# Patient Record
Sex: Female | Born: 1995 | Race: Black or African American | Hispanic: No | Smoking: Former smoker
Health system: Southern US, Community
[De-identification: ages and names within clinical notes are randomized; demographics above are authoritative.]

## PROBLEM LIST (undated history)

## (undated) DIAGNOSIS — B3731 Acute candidiasis of vulva and vagina: Secondary | ICD-10-CM

## (undated) DIAGNOSIS — B9689 Other specified bacterial agents as the cause of diseases classified elsewhere: Secondary | ICD-10-CM

## (undated) DIAGNOSIS — N760 Acute vaginitis: Secondary | ICD-10-CM

## (undated) DIAGNOSIS — T7840XA Allergy, unspecified, initial encounter: Secondary | ICD-10-CM

## (undated) DIAGNOSIS — B373 Candidiasis of vulva and vagina: Secondary | ICD-10-CM

## (undated) HISTORY — DX: Acute candidiasis of vulva and vagina: B37.31

## (undated) HISTORY — DX: Other specified bacterial agents as the cause of diseases classified elsewhere: N76.0

## (undated) HISTORY — DX: Other specified bacterial agents as the cause of diseases classified elsewhere: B96.89

## (undated) HISTORY — DX: Allergy, unspecified, initial encounter: T78.40XA

## (undated) HISTORY — PX: WISDOM TOOTH EXTRACTION: SHX21

## (undated) HISTORY — DX: Candidiasis of vulva and vagina: B37.3

---
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
## 2015-08-19 ENCOUNTER — Encounter: Payer: Self-pay | Admitting: Emergency Medicine

## 2015-08-19 ENCOUNTER — Emergency Department
Admission: EM | Admit: 2015-08-19 | Discharge: 2015-08-19 | Disposition: A | Discharge status: Home / Self Care | Payer: BLUE CROSS/BLUE SHIELD | Attending: Student | Admitting: Student

## 2015-08-19 DIAGNOSIS — X501XXA Overexertion from prolonged static or awkward postures, initial encounter: Secondary | ICD-10-CM | POA: Insufficient documentation

## 2015-08-19 DIAGNOSIS — Y929 Unspecified place or not applicable: Secondary | ICD-10-CM | POA: Insufficient documentation

## 2015-08-19 DIAGNOSIS — Y93F2 Activity, caregiving, lifting: Secondary | ICD-10-CM | POA: Insufficient documentation

## 2015-08-19 DIAGNOSIS — S4992XA Unspecified injury of left shoulder and upper arm, initial encounter: Secondary | ICD-10-CM | POA: Diagnosis present

## 2015-08-19 DIAGNOSIS — Y990 Civilian activity done for income or pay: Secondary | ICD-10-CM | POA: Insufficient documentation

## 2015-08-19 DIAGNOSIS — F17200 Nicotine dependence, unspecified, uncomplicated: Secondary | ICD-10-CM | POA: Diagnosis not present

## 2015-08-19 DIAGNOSIS — S46812A Strain of other muscles, fascia and tendons at shoulder and upper arm level, left arm, initial encounter: Secondary | ICD-10-CM | POA: Insufficient documentation

## 2015-08-19 DIAGNOSIS — S46912A Strain of unspecified muscle, fascia and tendon at shoulder and upper arm level, left arm, initial encounter: Secondary | ICD-10-CM

## 2015-08-19 MED ORDER — CYCLOBENZAPRINE HCL 5 MG PO TABS: 5.0000 mg | ORAL_TABLET | Freq: Three times a day (TID) | ORAL | Status: DC | PRN

## 2015-08-19 NOTE — Discharge Instructions (Signed)
Cryotherapy Cryotherapy is when you put ice on your injury. Ice helps lessen pain and puffiness (swelling) after an injury. Ice works the best when you start using it in the first 24 to 48 hours after an injury. HOME CARE  Put a dry or damp towel between the ice pack and your skin.  You may press gently on the ice pack.  Leave the ice on for no more than 10 to 20 minutes at a time.  Check your skin after 5 minutes to make sure your skin is okay.  Rest at least 20 minutes between ice pack uses.  Stop using ice when your skin loses feeling (numbness).  Do not use ice on someone who cannot tell you when it hurts. This includes small children and people with memory problems (dementia). GET HELP RIGHT AWAY IF:  You have white spots on your skin.  Your skin turns blue or pale.  Your skin feels waxy or hard.  Your puffiness gets worse. MAKE SURE YOU:   Understand these instructions.  Will watch your condition.  Will get help right away if you are not doing well or get worse.   This information is not intended to replace advice given to you by your health care provider. Make sure you discuss any questions you have with your health care provider.   Document Released: 08/22/2007 Document Revised: 05/28/2011 Document Reviewed: 10/26/2010 Elsevier Interactive Patient Education 2016 [employer]  Muscle Strain A muscle strain (pulled muscle) happens when a muscle is stretched beyond normal length. It happens when a sudden, violent force stretches your muscle too far. Usually, a few of the fibers in your muscle are torn. Muscle strain is common in athletes. Recovery usually takes 1-2 weeks. Complete healing takes 5-6 weeks.  HOME CARE   Follow the PRICE method of treatment to help your injury get better. Do this the first 2-3 days after the injury:  Protect. Protect the muscle to keep it from getting injured again.  Rest. Limit your activity and rest the injured body part.  Ice.  Put ice in a plastic bag. Place a towel between your skin and the bag. Then, apply the ice and leave it on from 15-20 minutes each hour. After the third day, switch to moist heat packs.  Compression. Use a splint or elastic bandage on the injured area for comfort. Do not put it on too tightly.  Elevate. Keep the injured body part above the level of your heart.  Only take medicine as told by your doctor.  Warm up before doing exercise to prevent future muscle strains. GET HELP IF:   You have more pain or puffiness (swelling) in the injured area.  You feel numbness, tingling, or notice a loss of strength in the injured area. MAKE SURE YOU:   Understand these instructions.  Will watch your condition.  Will get help right away if you are not doing well or get worse.   This information is not intended to replace advice given to you by your health care provider. Make sure you discuss any questions you have with your health care provider.   Document Released: 12/13/2007 Document Revised: 12/24/2012 Document Reviewed: 10/02/2012 Elsevier Interactive Patient Education 2016 [employer].   You appear to have a strain to the trapezius muscle of the left shoulder. Take OTC Aleve (naproxen) every morning and night for the next week or so. Take the prescription muscle relaxant as needed for muscle pain. Do NOT take the muscle relaxant  prior to work or driving. [facility] problems. Consider stretching exercises to warm-up the muscles before work.

## 2015-08-19 NOTE — ED Notes (Signed)
See triage note  States she lifts heavy boxes at work and developed pain from left elbow which radiates into left shoulder   Denies any fall  Positive pulses and good sensation

## 2015-08-19 NOTE — ED Notes (Signed)
Pt to ed with c/o left arm pain after lifting heavy boxes yesterday.

## 2015-08-19 NOTE — ED Provider Notes (Signed)
[facility] Emergency Department Provider Note ____________________________________________  Time seen: [phone]  I have reviewed the triage vital signs and the nursing notes.  HISTORY  Chief Complaint  Arm Pain  HPI Lori Chan is a 20 y.o. female presents to the ED for evaluation of discomfort to the left shoulder blade region. 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 She is right-hand-dominant female with complaints of tightness across the left scapular and trapezius musculature. She denies any outright injury, trauma, accident, or fall. She also denies any previous history shoulder problems. She has taken 2 Excedrin tablets yesterday without noted to get benefit. She denies any other injury at this time, and is without any distal paresthesias or grip changes. She rates her overall discomfort at a 7/10 in triage.  History reviewed. No pertinent past medical history.  There are no active problems to display for this patient.   History reviewed. No pertinent past surgical history.  Current Outpatient Rx  Name  Route  Sig  Dispense  Refill  . cyclobenzaprine (FLEXERIL) 5 MG tablet   Oral   Take 1 tablet (5 mg total) by mouth 3 (three) times daily as needed for muscle spasms.   15 tablet   0    Allergies Ibuprofen and Sulfa antibiotics  History reviewed. No pertinent family history.  Social History Social History  Substance Use Topics  . Smoking status: Current Every Day Smoker  . Smokeless tobacco: None  . Alcohol Use: No   Review of Systems  Constitutional: Negative for fever. Musculoskeletal: Negative for back pain. Left upper back pain. Skin: Negative for rash. Neurological: Negative for headaches, focal weakness or numbness. ____________________________________________  PHYSICAL EXAM:  VITAL SIGNS: ED Triage Vitals  Enc Vitals Group     BP 08/19/15 0824 132/83 mmHg     Pulse Rate  08/19/15 0824 77     Resp 08/19/15 0824 18     Temp 08/19/15 0824 98.3 F (36.8 C)     Temp Source 08/19/15 0824 Oral     SpO2 08/19/15 0824 99 %     Weight 08/19/15 0824 235 lb (106.595 kg)     Height 08/19/15 0824 5\' 3"  (1.6 m)     Head Cir --      Peak Flow --      Pain Score 08/19/15 0803 7     Pain Loc --      Pain Edu? --      Excl. in GC? --    Constitutional: Alert and oriented. Well appearing and in no distress. Head: Normocephalic and atraumatic. Cardiovascular: Normal rate, regular rhythm. Normal distal pulses.  Respiratory: Normal respiratory effort.  Musculoskeletal: Left shoulder without obvious deformity, dislocation, or sulcus sign. Patient in the palpation along the left trapezius as well as musculature. Normal rotator cuff testing and resistance on exam. Normal composite fist bilaterally. Normal spinal nontender without midline tenderness, spasm, deformity, or step-off. Normal neck range of motion without deficit. Nontender with normal range of motion in all extremities.  Neurologic:  Cranial nerves II through XII grossly intact. Normal UE DTRs bilaterally. Normal gait without ataxia. Normal speech and language. No gross focal neurologic deficits are appreciated. Skin:  Skin is warm, dry and intact. No rash noted. ____________________________________________  INITIAL IMPRESSION / ASSESSMENT AND PLAN / ED COURSE  Patient with acute left shoulder strain and trapezius muscle strain secondary to overuse. 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 She should dose the cyclobenzaprine as directed. She is also dose over-the-counter Aleve as needed for pain relief. She is advised to apply ice to the muscles for soreness as necessary. She is also given instruction on exercises to warm up the arms and shoulders prior to work. She will follow up with Dr. Hyacinth Meeker as  needed. ____________________________________________  FINAL CLINICAL IMPRESSION(S) / ED DIAGNOSES  Final diagnoses:  Shoulder strain, left, initial encounter  Trapezius muscle strain, left, initial encounter     Lissa Hoard, PA-C 08/19/15 1638  Gayla Doss, MD 08/19/15 1655

## 2015-12-07 ENCOUNTER — Encounter: Payer: Self-pay | Admitting: Emergency Medicine

## 2015-12-07 DIAGNOSIS — F17200 Nicotine dependence, unspecified, uncomplicated: Secondary | ICD-10-CM | POA: Diagnosis not present

## 2015-12-07 DIAGNOSIS — R21 Rash and other nonspecific skin eruption: Secondary | ICD-10-CM | POA: Diagnosis present

## 2015-12-07 NOTE — ED Triage Notes (Signed)
Patient ambulatory to triage with steady gait, without difficulty or distress noted; pt reports itchy rash to arms/legs x 2 days with no known cause; using OTC cortisone and benadryl without relief

## 2015-12-08 ENCOUNTER — Emergency Department
Admission: EM | Admit: 2015-12-08 | Discharge: 2015-12-08 | Disposition: A | Discharge status: Home / Self Care | Payer: BLUE CROSS/BLUE SHIELD | Attending: Student | Admitting: Student

## 2015-12-08 DIAGNOSIS — R21 Rash and other nonspecific skin eruption: Secondary | ICD-10-CM

## 2015-12-08 MED ORDER — DEXAMETHASONE 1 MG/ML PO CONC: 10.0000 mg | Freq: Once | ORAL | Status: DC

## 2015-12-08 MED ORDER — DEXAMETHASONE SODIUM PHOSPHATE 10 MG/ML IJ SOLN
INTRAMUSCULAR | Status: AC
  Administered 2015-12-08: 10 mg
  Filled 2015-12-08: qty 1

## 2015-12-08 MED ORDER — DEXAMETHASONE SODIUM PHOSPHATE 10 MG/ML IJ SOLN
10.0000 mg | Freq: Once | INTRAMUSCULAR | Status: AC
  Administered 2015-12-08: 10 mg

## 2015-12-08 NOTE — ED Provider Notes (Signed)
[facility] Emergency Department Provider Note   ____________________________________________   First MD Initiated Contact with Patient 12/08/15 0105     (approximate)  I have reviewed the triage vital signs and the nursing notes.   HISTORY  Chief Complaint Rash    HPI Dilia Alemany is a 20 y.o. female with no chronic medical problems who presents for evaluation of pruritic rash on bilateral arms for the past 2 days, gradual onset, constant, moderate, no modifying factors. Patient reports she has seen a few lesions on the right leg as well. She reports a history of allergic reaction to Bactrim but denies any contact with that. She denies any new soaps, detergents, lotions. No abdominal pain, vomiting, diarrhea, shortness of breath or lip swelling. She has used over the counter cortisone ointment and Benadryl without significant improvement of her symptoms. She denies fevers. She has never had a severe allergic reaction requiring EpiPen.   History reviewed. No pertinent past medical history.  There are no active problems to display for this patient.   History reviewed. No pertinent surgical history.  Prior to Admission medications   Medication Sig Start Date End Date Taking? Authorizing Provider  cyclobenzaprine (FLEXERIL) 5 MG tablet Take 1 tablet (5 mg total) by mouth 3 (three) times daily as needed for muscle spasms. 08/19/15   Jenise V Bacon Menshew, PA-C    Allergies Ibuprofen and Sulfa antibiotics  No family history on file.  Social History Social History  Substance Use Topics  . Smoking status: Current Every Day Smoker  . Smokeless tobacco: Never Used  . Alcohol use No    Review of Systems Constitutional: No fever/chills Eyes: No visual changes. ENT: No sore throat. Cardiovascular: Denies chest pain. Respiratory: Denies shortness of breath. Gastrointestinal: No abdominal pain.  No nausea, no vomiting.  No diarrhea.  No  constipation. Genitourinary: Negative for dysuria. Musculoskeletal: Negative for back pain. Skin: Positive for rash. Neurological: Negative for headaches, focal weakness or numbness.  10-point ROS otherwise negative.  ____________________________________________   PHYSICAL EXAM:  VITAL SIGNS: ED Triage Vitals  Enc Vitals Group     BP 12/07/15 2309 104/74     Pulse Rate 12/07/15 2309 93     Resp 12/07/15 2309 18     Temp 12/07/15 2309 97.6 F (36.4 C)     Temp src --      SpO2 12/07/15 2309 99 %     Weight 12/07/15 2308 239 lb (108.4 kg)     Height 12/07/15 2308 5\' 3"  (1.6 m)     Head Circumference --      Peak Flow --      Pain Score 12/08/15 0101 0     Pain Loc --      Pain Edu? --      Excl. in GC? --     Constitutional: Alert and oriented. Well appearing and in no acute distress. Eyes: Conjunctivae are normal. PERRL. EOMI. Head: Atraumatic. Nose: No congestion/rhinnorhea. Mouth/Throat: Mucous membranes are moist.  Oropharynx non-erythematous Without edema. Neck: No stridor.   Cardiovascular: Normal rate, regular rhythm. Grossly normal heart sounds.  Good peripheral circulation. Respiratory: Normal respiratory effort.  No retractions. Lungs CTAB. Gastrointestinal: Soft and nontender. No distention. No CVA tenderness. Genitourinary: deferred Musculoskeletal: No lower extremity tenderness nor edema.  No joint effusions. Neurologic:  Normal speech and language. No gross focal neurologic deficits are appreciated. No gait instability. Skin:  Skin is warm, dry and intact. There are several round discrete raised,  mildly erythematous, blanching papules in the arms bilaterally, one on the leg totaling about 15-20 papules. No involvement of the mucous membranes or the palms of the hands. No surrounding erythema, induration, warmth or fluctuance. Psychiatric: Mood and affect are normal. Speech and behavior are normal.  ____________________________________________   LABS (all  labs ordered are listed, but only abnormal results are displayed)  Labs Reviewed - No data to display ____________________________________________  EKG  none ____________________________________________  RADIOLOGY  none ____________________________________________   PROCEDURES  Procedure(s) performed: None  Procedures  Critical Care performed: No  ____________________________________________   INITIAL IMPRESSION / ASSESSMENT AND PLAN / ED COURSE  Pertinent labs & imaging results that were available during my care of the patient were reviewed by me and considered in my medical decision making (see chart for details).  Elmer SowDenisha Lun is a 20 y.o. female with no chronic medical problems who presents for evaluation of pruritic rash on bilateral arms for the past 2 days. On exam, she is very well-appearing and in no acute distress. Her vital signs are stable and she is afebrile. She has a nonspecific rash which at first appears more consistent with individual insect bite though this could also represent contact dermatitis. She reports that she hasn't had known contact with bedbugs or chiggers but she has had many mosquito bites. She received by mouth Decadron in the emergency department and is encouraged to continue using over-the-counter hydrocortisone and Benadryl according to package instructions. We discussed return precautions and need for close follow-up and she is comfortable with the discharge plan. DC home.  Clinical Course     ____________________________________________   FINAL CLINICAL IMPRESSION(S) / ED DIAGNOSES  Final diagnoses:  Rash      NEW MEDICATIONS STARTED DURING THIS VISIT:  New Prescriptions   No medications on file     Note:  This document was prepared using Dragon voice recognition software and may include unintentional dictation errors.    Gayla DossEryka A Jeanie Mccard, MD 12/08/15 0230

## 2016-07-11 ENCOUNTER — Emergency Department
Admission: EM | Admit: 2016-07-11 | Discharge: 2016-07-11 | Disposition: A | Discharge status: Home / Self Care | Payer: BLUE CROSS/BLUE SHIELD | Attending: Student in an Organized Health Care Education/Training Program | Admitting: Student in an Organized Health Care Education/Training Program

## 2016-07-11 ENCOUNTER — Encounter: Payer: Self-pay | Admitting: Emergency Medicine

## 2016-07-11 DIAGNOSIS — R112 Nausea with vomiting, unspecified: Secondary | ICD-10-CM | POA: Insufficient documentation

## 2016-07-11 DIAGNOSIS — R197 Diarrhea, unspecified: Secondary | ICD-10-CM | POA: Diagnosis not present

## 2016-07-11 DIAGNOSIS — Z87891 Personal history of nicotine dependence: Secondary | ICD-10-CM | POA: Insufficient documentation

## 2016-07-11 LAB — COMPREHENSIVE METABOLIC PANEL
ALBUMIN: 3.7 g/dL (ref 3.5–5.0)
ALT: 12 U/L — AB (ref 14–54)
AST: 22 U/L (ref 15–41)
Alkaline Phosphatase: 70 U/L (ref 38–126)
Anion gap: 7 (ref 5–15)
BUN: 7 mg/dL (ref 6–20)
CO2: 23 mmol/L (ref 22–32)
CREATININE: 0.75 mg/dL (ref 0.44–1.00)
Calcium: 8.6 mg/dL — ABNORMAL LOW (ref 8.9–10.3)
Chloride: 105 mmol/L (ref 101–111)
GFR calc Af Amer: >60 mL/min (ref >60)
GFR calc non Af Amer: >60 mL/min (ref >60)
Glucose, Bld: 101 mg/dL — ABNORMAL HIGH (ref 65–99)
POTASSIUM: 3.2 mmol/L — AB (ref 3.5–5.1)
SODIUM: 135 mmol/L (ref 135–145)
Total Bilirubin: 0.3 mg/dL (ref 0.3–1.2)
Total Protein: 7.8 g/dL (ref 6.5–8.1)

## 2016-07-11 LAB — CBC
HEMATOCRIT: 34.7 % — AB (ref 35.0–47.0)
Hemoglobin: 11.7 g/dL — ABNORMAL LOW (ref 12.0–16.0)
MCH: 26.9 pg (ref 26.0–34.0)
MCHC: 33.7 g/dL (ref 32.0–36.0)
MCV: 79.6 fL — AB (ref 80.0–100.0)
PLATELETS: 362 10*3/uL (ref 150–440)
RBC: 4.36 MIL/uL (ref 3.80–5.20)
RDW: 15.3 % — AB (ref 11.5–14.5)
WBC: 9.4 10*3/uL (ref 3.6–11.0)

## 2016-07-11 LAB — LIPASE, BLOOD: LIPASE: 33 U/L (ref 11–51)

## 2016-07-11 MED ORDER — PROMETHAZINE HCL 25 MG/ML IJ SOLN
INTRAMUSCULAR | Status: AC
  Filled 2016-07-11: qty 1

## 2016-07-11 MED ORDER — PROMETHAZINE HCL 12.5 MG PO TABS: 12.5000 mg | ORAL_TABLET | Freq: Four times a day (QID) | ORAL | 0 refills | Status: DC | PRN

## 2016-07-11 MED ORDER — ONDANSETRON 4 MG PO TBDP
4.0000 mg | ORAL_TABLET | Freq: Once | ORAL | Status: AC
  Administered 2016-07-11: 4 mg via ORAL

## 2016-07-11 MED ORDER — PROMETHAZINE HCL 25 MG/ML IJ SOLN
12.5000 mg | Freq: Once | INTRAMUSCULAR | Status: DC
  Filled 2016-07-11: qty 1

## 2016-07-11 MED ORDER — SODIUM CHLORIDE 0.9 % IV BOLUS (SEPSIS)
1000.0000 mL | Freq: Once | INTRAVENOUS | Status: DC
  Filled 2016-07-11: qty 1000

## 2016-07-11 MED ORDER — ONDANSETRON 4 MG PO TBDP
ORAL_TABLET | ORAL | Status: AC
  Filled 2016-07-11: qty 1

## 2016-07-11 NOTE — ED Notes (Signed)
Pt refused IV and IV fluids states " I don't do needles". Attempted to explain benefit of fluids and IV meds, but patient is persistent about refusal. MD made aware, po meds given.

## 2016-07-11 NOTE — ED Provider Notes (Signed)
[facility] Emergency Department Provider Note    First MD Initiated Contact with Patient 07/11/16 2129     (approximate)  I have reviewed the triage vital signs and the nursing notes.   HISTORY  Chief Complaint Abdominal Pain    HPI Lori Chan is a 21 y.o. female who presents with 1 day of nausea vomiting and for times of nonbloody diarrhea. States that with the diarrhea she's been having crampy abdominal pain. Denies any eating any uncooked food. No fevers. Has had some chills. No chest pain or shortness of breath. Denies any pain at this moment.States that when she was having the pain it was 9 out of 10 in severity, crampy and diffuse. Was not worsened by eating or movement. Did resolve after having episodes of diarrhea.   History reviewed. No pertinent past medical history. FMH:  No h/o IBD History reviewed. No pertinent surgical history. There are no active problems to display for this patient.     Prior to Admission medications   Medication Sig Start Date End Date Taking? Authorizing Provider  cyclobenzaprine (FLEXERIL) 5 MG tablet Take 1 tablet (5 mg total) by mouth 3 (three) times daily as needed for muscle spasms. 08/19/15   Jenise V Bacon Menshew, PA-C  promethazine (PHENERGAN) 12.5 MG tablet Take 1 tablet (12.5 mg total) by mouth every 6 (six) hours as needed for nausea or vomiting. 07/11/16   Willy Eddy, MD    Allergies Ibuprofen and Sulfa antibiotics    Social History Social History  Substance Use Topics  . Smoking status: Former Games developer  . Smokeless tobacco: Never Used  . Alcohol use No    Review of Systems Patient denies headaches, rhinorrhea, blurry vision, numbness, shortness of breath, chest pain, edema, cough, abdominal pain, nausea, vomiting, diarrhea, dysuria, fevers, rashes or hallucinations unless otherwise stated above in HPI. ____________________________________________   PHYSICAL EXAM:  VITAL  SIGNS: Vitals:   07/11/16 2051 07/11/16 2234  BP: 129/70 122/70  Pulse: (!) 113 68  Resp: 18 17  Temp: 99.7 F (37.6 C)     Constitutional: Alert and oriented. Well appearing and in no acute distress. Eyes: Conjunctivae are normal. PERRL. EOMI. Head: Atraumatic. Nose: No congestion/rhinnorhea. Mouth/Throat: Mucous membranes are moist.  Oropharynx non-erythematous. Neck: No stridor. Painless ROM. No cervical spine tenderness to palpation Hematological/Lymphatic/Immunilogical: No cervical lymphadenopathy. Cardiovascular: Normal rate, regular rhythm. Grossly normal heart sounds.  Good peripheral circulation. Respiratory: Normal respiratory effort.  No retractions. Lungs CTAB. Gastrointestinal: Soft and nontender. No distention. No abdominal bruits. No CVA tenderness. Genitourinary:  Musculoskeletal: No lower extremity tenderness nor edema.  No joint effusions. Neurologic:  Normal speech and language. No gross focal neurologic deficits are appreciated. No gait instability. Skin:  Skin is warm, dry and intact. No rash noted. Psychiatric: Mood and affect are normal. Speech and behavior are normal.  ____________________________________________   LABS (all labs ordered are listed, but only abnormal results are displayed)  Results for orders placed or performed during the hospital encounter of 07/11/16 (from the past 24 hour(s))  Lipase, blood     Status: None   Collection Time: 07/11/16  8:53 PM  Result Value Ref Range   Lipase 33 11 - 51 U/L  Comprehensive metabolic panel     Status: Abnormal   Collection Time: 07/11/16  8:53 PM  Result Value Ref Range   Sodium 135 135 - 145 mmol/L   Potassium 3.2 (L) 3.5 - 5.1 mmol/L   Chloride 105 101 - 111  mmol/L   CO2 23 22 - 32 mmol/L   Glucose, Bld 101 (H) 65 - 99 mg/dL   BUN 7 6 - 20 mg/dL   Creatinine, Ser 1.61 0.44 - 1.00 mg/dL   Calcium 8.6 (L) 8.9 - 10.3 mg/dL   Total Protein 7.8 6.5 - 8.1 g/dL   Albumin 3.7 3.5 - 5.0 g/dL   AST  22 15 - 41 U/L   ALT 12 (L) 14 - 54 U/L   Alkaline Phosphatase 70 38 - 126 U/L   Total Bilirubin 0.3 0.3 - 1.2 mg/dL   GFR calc non Af Amer >60 >60 mL/min   GFR calc Af Amer >60 >60 mL/min   Anion gap 7 5 - 15  CBC     Status: Abnormal   Collection Time: 07/11/16  8:53 PM  Result Value Ref Range   WBC 9.4 3.6 - 11.0 K/uL   RBC 4.36 3.80 - 5.20 MIL/uL   Hemoglobin 11.7 (L) 12.0 - 16.0 g/dL   HCT 09.6 (L) 04.5 - 40.9 %   MCV 79.6 (L) 80.0 - 100.0 fL   MCH 26.9 26.0 - 34.0 pg   MCHC 33.7 32.0 - 36.0 g/dL   RDW 81.1 (H) 91.4 - 78.2 %   Platelets 362 150 - 440 K/uL   ____________________________________________   ____________________________________________  ____________________________________________   PROCEDURES  Procedure(s) performed:  Procedures    Critical Care performed: no ____________________________________________   INITIAL IMPRESSION / ASSESSMENT AND PLAN / ED COURSE  Pertinent labs & imaging results that were available during my care of the patient were reviewed by me and considered in my medical decision making (see chart for details).  DDX: enteritis, colitis, flu like illness, food poisoning  Lori Chan is a 21 y.o. who presents to the ED with GI symptoms as described above.  Patient is AF, with mild tachycardia but otherwise well perfused and normotensive.  Exam as above. Given current presentation have considered the above differential.  Blood work is fairly reassured. They start in her symptoms I did recommend IV fluids but the patient refused this. Her abdominal exam is soft and benign. I do not feel that emergent CT imaging is clinically indicated as this does not seem to represent an acute surgical process. Patient was given Zofran ODT with improvement in her symptoms. She is able to tolerate oral hydration. Repeat abdominal exam was again soft and benign. Patient provided a prescription for antiemetics for trial of outpatient management. Discussed  signs and symptoms for which she should return to the ER.  Have discussed with the patient and available family all diagnostics and treatments performed thus far and all questions were answered to the best of my ability. The patient demonstrates understanding and agreement with plan.        ____________________________________________   FINAL CLINICAL IMPRESSION(S) / ED DIAGNOSES  Final diagnoses:  Nausea vomiting and diarrhea      NEW MEDICATIONS STARTED DURING THIS VISIT:  Discharge Medication List as of 07/11/2016 10:29 PM    START taking these medications   Details  promethazine (PHENERGAN) 12.5 MG tablet Take 1 tablet (12.5 mg total) by mouth every 6 (six) hours as needed for nausea or vomiting., Starting Wed 07/11/2016, Print         Note:  This document was prepared using Dragon voice recognition software and may include unintentional dictation errors.    Willy Eddy, MD 07/11/16 [phone]

## 2016-07-11 NOTE — ED Triage Notes (Signed)
Patient ambulatory to triage with steady gait, without difficulty or distress noted; pt reports lower abd pain since this morning; Vx4 with diarrhea

## 2016-07-11 NOTE — ED Notes (Signed)
Pt reports that she has been having abd pain with N/V and diarrhea since this am (vomited x4 today - 15+ loose stools today)

## 2016-09-10 ENCOUNTER — Emergency Department (HOSPITAL_COMMUNITY)
Admission: EM | Admit: 2016-09-10 | Discharge: 2016-09-10 | Disposition: A | Discharge status: Home / Self Care | Payer: BLUE CROSS/BLUE SHIELD | Attending: Emergency Medicine | Admitting: Emergency Medicine

## 2016-09-10 ENCOUNTER — Encounter (HOSPITAL_COMMUNITY): Payer: Self-pay | Admitting: *Deleted

## 2016-09-10 ENCOUNTER — Emergency Department (HOSPITAL_COMMUNITY): Payer: BLUE CROSS/BLUE SHIELD

## 2016-09-10 DIAGNOSIS — Z87891 Personal history of nicotine dependence: Secondary | ICD-10-CM | POA: Diagnosis not present

## 2016-09-10 DIAGNOSIS — J181 Lobar pneumonia, unspecified organism: Secondary | ICD-10-CM | POA: Insufficient documentation

## 2016-09-10 DIAGNOSIS — J189 Pneumonia, unspecified organism: Secondary | ICD-10-CM

## 2016-09-10 DIAGNOSIS — R05 Cough: Secondary | ICD-10-CM | POA: Diagnosis present

## 2016-09-10 MED ORDER — ALBUTEROL SULFATE (2.5 MG/3ML) 0.083% IN NEBU
2.5000 mg | INHALATION_SOLUTION | Freq: Once | RESPIRATORY_TRACT | Status: AC
  Administered 2016-09-10: 2.5 mg via RESPIRATORY_TRACT
  Filled 2016-09-10: qty 3

## 2016-09-10 MED ORDER — ALBUTEROL SULFATE HFA 108 (90 BASE) MCG/ACT IN AERS
2.0000 | INHALATION_SPRAY | Freq: Once | RESPIRATORY_TRACT | Status: AC
  Administered 2016-09-10: 2 via RESPIRATORY_TRACT
  Filled 2016-09-10: qty 6.7

## 2016-09-10 MED ORDER — LEVOFLOXACIN 500 MG PO TABS: 500.0000 mg | ORAL_TABLET | Freq: Every day | ORAL | 0 refills | Status: DC

## 2016-09-10 MED ORDER — LEVOFLOXACIN 500 MG PO TABS
500.0000 mg | ORAL_TABLET | Freq: Once | ORAL | Status: AC
  Administered 2016-09-10: 500 mg via ORAL
  Filled 2016-09-10: qty 1

## 2016-09-10 MED ORDER — IPRATROPIUM-ALBUTEROL 0.5-2.5 (3) MG/3ML IN SOLN
3.0000 mL | Freq: Once | RESPIRATORY_TRACT | Status: AC
  Administered 2016-09-10: 3 mL via RESPIRATORY_TRACT
  Filled 2016-09-10: qty 3

## 2016-09-10 NOTE — ED Triage Notes (Signed)
States she has had a cough for over 2 weeks and some episodes of diarrhea also.

## 2016-09-10 NOTE — ED Provider Notes (Signed)
[facility]-EMERGENCY DEPT Provider Note   CSN: [id number] Arrival date & time: 09/10/16  0920     History   Chief Complaint Chief Complaint  Patient presents with  . Cough    HPI Lori Chan is a 21 y.o. female.  HPI   Lori Chan is a 21 y.o. female who presents to the Emergency Department complaining of Persistent cough for 2 weeks. States the cough is intermittently productive of clear sputum. She also reports some nasal congestion at onset, but has now improved. She's been taking over-the-counter cough and cold medications with minimal relief. She reports increased coughing and wheezing with exertion. Symptoms began after her child came home sick from daycare.  She denies fever, chills, shortness of breath and chest pain.  History reviewed. No pertinent past medical history.  There are no active problems to display for this patient.   History reviewed. No pertinent surgical history.  OB History    No data available       Home Medications    Prior to Admission medications   Medication Sig Start Date End Date Taking? Authorizing Provider  cyclobenzaprine (FLEXERIL) 5 MG tablet Take 1 tablet (5 mg total) by mouth 3 (three) times daily as needed for muscle spasms. 08/19/15   Menshew, Charlesetta Ivory, PA-C  promethazine (PHENERGAN) 12.5 MG tablet Take 1 tablet (12.5 mg total) by mouth every 6 (six) hours as needed for nausea or vomiting. 07/11/16   Willy Eddy, MD    Family History No family history on file.  Social History Social History  Substance Use Topics  . Smoking status: Former Games developer  . Smokeless tobacco: Never Used  . Alcohol use No     Allergies   Ibuprofen and Sulfa antibiotics   Review of Systems Review of Systems  Constitutional: Negative for activity change, appetite change, chills and fever.  HENT: Positive for congestion and rhinorrhea. Negative for facial swelling, sneezing, sore throat and trouble swallowing.   Eyes: Negative for  visual disturbance.  Respiratory: Positive for cough and wheezing. Negative for chest tightness, shortness of breath and stridor.   Cardiovascular: Negative for chest pain.  Gastrointestinal: Negative for abdominal pain, nausea and vomiting.  Musculoskeletal: Negative for neck pain and neck stiffness.  Skin: Negative.  Negative for rash.  Neurological: Negative for dizziness, weakness, numbness and headaches.  Hematological: Negative for adenopathy.  Psychiatric/Behavioral: Negative for confusion.  All other systems reviewed and are negative.    Physical Exam Updated Vital Signs BP 117/68   Pulse (!) 105   Temp 98 F (36.7 C) (Oral)   Resp 20   Ht 5\' 3"  (1.6 m)   Wt 110.7 kg (244 lb)   LMP 08/27/2016   SpO2 99%   BMI 43.22 kg/m   Physical Exam  Constitutional: She is oriented to person, place, and time. She appears well-developed and well-nourished. No distress.  HENT:  Head: Normocephalic and atraumatic.  Right Ear: Tympanic membrane and ear canal normal.  Left Ear: Tympanic membrane and ear canal normal.  Mouth/Throat: Uvula is midline, oropharynx is clear and moist and mucous membranes are normal. No oropharyngeal exudate.  Eyes: EOM are normal. Pupils are equal, round, and reactive to light.  Neck: Normal range of motion, full passive range of motion without pain and phonation normal. Neck supple.  Cardiovascular: Normal rate, regular rhythm and normal heart sounds.   No murmur heard. Pulmonary/Chest: Effort normal. No stridor. No respiratory distress. She has wheezes. She has no rales. She exhibits no  tenderness.  Coarse lungs sounds with scattered expiratory wheezes bilaterally  Musculoskeletal: She exhibits no edema.  Lymphadenopathy:    She has no cervical adenopathy.  Neurological: She is alert and oriented to person, place, and time. She exhibits normal muscle tone. Coordination normal.  Skin: Skin is warm and dry. No rash noted.  Nursing note and vitals  reviewed.    ED Treatments / Results  Labs (all labs ordered are listed, but only abnormal results are displayed) Labs Reviewed - No data to display  EKG  EKG Interpretation None       Radiology Dg Chest 2 View  Result Date: 09/10/2016 CLINICAL DATA:  Two weeks of cough with 3 days of productive cough. Current smoker. EXAM: CHEST  2 VIEW COMPARISON:  Chest x-ray of Aug 09, 2010 FINDINGS: There is hazy increased alveolar opacity in the periphery of the left mid lung. The right lung is clear. There is no pleural effusion or pneumothorax. The heart and pulmonary vascularity are normal. The bony thorax is unremarkable. IMPRESSION: Pneumonia in the left mid lung. Follow-up radiographs following anticipated antibiotic therapy are recommended unless the patient's symptoms completely resolve. Electronically Signed   By: David  SwazilandJordan M.D.   On: 09/10/2016 11:04     Procedures Procedures (including critical care time)  Medications Ordered in ED Medications  albuterol (PROVENTIL HFA;VENTOLIN HFA) 108 (90 Base) MCG/ACT inhaler 2 puff (not administered)  ipratropium-albuterol (DUONEB) 0.5-2.5 (3) MG/3ML nebulizer solution 3 mL (not administered)  albuterol (PROVENTIL) (2.5 MG/3ML) 0.083% nebulizer solution 2.5 mg (not administered)     Initial Impression / Assessment and Plan / ED Course  I have reviewed the triage vital signs and the nursing notes.  Pertinent labs & imaging results that were available during my care of the patient were reviewed by me and considered in my medical decision making (see chart for details).     Patient is well-appearing. Vital signs are stable. Symptoms are likely related to bronchitis. Lung sounds improved after albuterol neb.  Discussed XR findings, pt appears stable for d/c.  Albuterol MDI dispensed.  Pt agrees to PCP f/u to ensure resolution.  Return precautions also discussed.  Final Clinical Impressions(s) / ED Diagnoses   Final diagnoses:    Community acquired pneumonia of left lung, unspecified part of lung    New Prescriptions New Prescriptions   No medications on file     Pauline Ausriplett, Trevon Strothers, Cordelia Poche-C 09/12/16 1751    Bethann BerkshireZammit, Joseph, MD 09/14/16 1016

## 2016-09-10 NOTE — Discharge Instructions (Signed)
1-2 puffs of the inhaler every 4-6 hours as needed. Tylenol or ibuprofen every 4-6 hours if needed for fever or body aches. Drink plenty of fluids. Follow-up with your doctor for recheck or return to the ER for any worsening symptoms.

## 2017-01-18 ENCOUNTER — Emergency Department (HOSPITAL_COMMUNITY): Payer: Self-pay

## 2017-01-18 ENCOUNTER — Emergency Department (HOSPITAL_COMMUNITY)
Admission: EM | Admit: 2017-01-18 | Discharge: 2017-01-18 | Disposition: A | Discharge status: Home / Self Care | Payer: Self-pay | Attending: Emergency Medicine | Admitting: Emergency Medicine

## 2017-01-18 ENCOUNTER — Encounter (HOSPITAL_COMMUNITY): Payer: Self-pay | Admitting: Emergency Medicine

## 2017-01-18 DIAGNOSIS — R072 Precordial pain: Secondary | ICD-10-CM | POA: Insufficient documentation

## 2017-01-18 DIAGNOSIS — Z87891 Personal history of nicotine dependence: Secondary | ICD-10-CM | POA: Insufficient documentation

## 2017-01-18 LAB — BASIC METABOLIC PANEL
ANION GAP: 10 (ref 5–15)
BUN: 10 mg/dL (ref 6–20)
CO2: 23 mmol/L (ref 22–32)
Calcium: 9.1 mg/dL (ref 8.9–10.3)
Chloride: 106 mmol/L (ref 101–111)
Creatinine, Ser: 0.73 mg/dL (ref 0.44–1.00)
Glucose, Bld: 104 mg/dL — ABNORMAL HIGH (ref 65–99)
POTASSIUM: 3.9 mmol/L (ref 3.5–5.1)
SODIUM: 139 mmol/L (ref 135–145)

## 2017-01-18 LAB — CBC WITH DIFFERENTIAL/PLATELET
BASOS ABS: 0 10*3/uL (ref 0.0–0.1)
Basophils Relative: 0 %
EOS ABS: 0.1 10*3/uL (ref 0.0–0.7)
EOS PCT: 1 %
HCT: 35.4 % — ABNORMAL LOW (ref 36.0–46.0)
Hemoglobin: 11.6 g/dL — ABNORMAL LOW (ref 12.0–15.0)
LYMPHS PCT: 27 %
Lymphs Abs: 2.3 10*3/uL (ref 0.7–4.0)
MCH: 26.8 pg (ref 26.0–34.0)
MCHC: 32.8 g/dL (ref 30.0–36.0)
MCV: 81.8 fL (ref 78.0–100.0)
Monocytes Absolute: 0.4 10*3/uL (ref 0.1–1.0)
Monocytes Relative: 4 %
Neutro Abs: 5.9 10*3/uL (ref 1.7–7.7)
Neutrophils Relative %: 68 %
PLATELETS: 328 10*3/uL (ref 150–400)
RBC: 4.33 MIL/uL (ref 3.87–5.11)
RDW: 14.4 % (ref 11.5–15.5)
WBC: 8.7 10*3/uL (ref 4.0–10.5)

## 2017-01-18 LAB — TROPONIN I

## 2017-01-18 LAB — HCG, QUANTITATIVE, PREGNANCY

## 2017-01-18 MED ORDER — GI COCKTAIL ~~LOC~~
30.0000 mL | Freq: Once | ORAL | Status: AC
  Administered 2017-01-18: 30 mL via ORAL
  Filled 2017-01-18: qty 30

## 2017-01-18 NOTE — ED Provider Notes (Signed)
Emergency Department Provider Note   I have reviewed the triage vital signs and the nursing notes.   HISTORY  Chief Complaint Chest Pain   HPI Lori Chan is a 21 y.o. female presents to the emergency department for evaluation of worsening chest tightness over the past week.  Patient has had central tightness in her chest which is been near constant for the last 7 days.  Yesterday at work got suddenly worse when she felt lightheaded.  Pain radiated slightly to the left shoulder.  She took her blood pressure at work which she said was 140s and heart rate in the 70s.  She felt like these were elevated and so when she continued to have chest discomfort this morning she presented to the emergency department.  She is having active pain at this moment.  She states it is worse with moving especially twisting to the left.  No pleuritic or exertional component to pain.  No fevers, chills, productive cough.   History reviewed. No pertinent past medical history.  There are no active problems to display for this patient.   History reviewed. No pertinent surgical history.  Current Outpatient Rx  . Order #: [id number] Class: Print  . Order #: [id number] Class: Print  . Order #: [id number] Class: Historical Med    Allergies Ibuprofen and Sulfa antibiotics  No family history on file.  Social History Social History  Substance Use Topics  . Smoking status: Former Games developer  . Smokeless tobacco: Never Used  . Alcohol use No    Review of Systems  Constitutional: No fever/chills   Eyes: No visual changes. ENT: No sore throat. Cardiovascular: Positive chest pain. Respiratory: Denies shortness of breath. Gastrointestinal: No abdominal pain. No nausea, no vomiting. No diarrhea. No constipation. Genitourinary: Negative for dysuria. Musculoskeletal: Negative for back pain. Skin: Negative for rash. Neurological: Negative for headaches, focal weakness or numbness.  10-point ROS otherwise  negative.  ____________________________________________   PHYSICAL EXAM:  VITAL SIGNS: ED Triage Vitals  Enc Vitals Group     BP --      Pulse Rate 01/18/17 0703 63     Resp 01/18/17 0703 18     Temp 01/18/17 0703 98.3 F (36.8 C)     Temp src --      SpO2 01/18/17 0703 100 %     Weight 01/18/17 0702 245 lb (111.1 kg)     Height 01/18/17 0702 5\' 3"  (1.6 m)     Pain Score 01/18/17 0701 8   Constitutional: Alert and oriented. Well appearing and in no acute distress. Eyes: Conjunctivae are normal. Head: Atraumatic. Nose: No congestion/rhinnorhea. Mouth/Throat: Mucous membranes are moist.  Neck: No stridor.  Cardiovascular: Normal rate, regular rhythm. Good peripheral circulation. Grossly normal heart sounds.   Respiratory: Normal respiratory effort.  No retractions. Lungs CTAB. Gastrointestinal: Soft and nontender. No distention.  Musculoskeletal: No lower extremity tenderness nor edema. No gross deformities of extremities. Positive tenderness to palpation over the sternum.  Neurologic:  Normal speech and language. No gross focal neurologic deficits are appreciated.  Skin:  Skin is warm, dry and intact. No rash noted.   ____________________________________________   LABS (all labs ordered are listed, but only abnormal results are displayed)  Labs Reviewed  CBC WITH DIFFERENTIAL/PLATELET - Abnormal; Notable for the following:       Result Value   Hemoglobin 11.6 (*)    HCT 35.4 (*)    All other components within normal limits  BASIC METABOLIC PANEL - Abnormal; Notable for the  following:    Glucose, Bld 104 (*)    All other components within normal limits  TROPONIN I  HCG, QUANTITATIVE, PREGNANCY   ____________________________________________  EKG   EKG Interpretation  Date/Time:  Friday January 18 2017 07:07:41 EDT Ventricular Rate:  66 PR Interval:    QRS Duration: 99 QT Interval:  412 QTC Calculation: 432 R Axis:   51 Text Interpretation:  Sinus rhythm  No STEMI. No old tracings.  Confirmed by Alona BeneLong, Evelina Lore [phone]([id number]) on 01/18/2017 7:17:16 AM       ____________________________________________  RADIOLOGY  Dg Chest 2 View  Result Date: 01/18/2017 CLINICAL DATA:  Chest pain EXAM: CHEST  2 VIEW COMPARISON:  September 10, 2016 FINDINGS: The lungs are clear. Heart size and pulmonary vascularity are normal. No adenopathy. No pneumothorax. No bone lesions. IMPRESSION: No edema or consolidation. Electronically Signed   By: Bretta BangWilliam  Woodruff III M.D.   On: 01/18/2017 08:23    ____________________________________________   PROCEDURES  Procedure(s) performed:   Procedures  None ____________________________________________   INITIAL IMPRESSION / ASSESSMENT AND PLAN / ED COURSE  Pertinent labs & imaging results that were available during my care of the patient were reviewed by me and considered in my medical decision making (see chart for details).  Patient presents to the emergency department for evaluation of constant chest discomfort over the past week, worsening yesterday.  Patient has elevated BMI is a smoker.  No family history of acute MI at a very young age.  Patient symptoms are reproducible to palpation of the sternum and worse with twisting.  Suspect musculoskeletal etiology.  Patient also discussing some improvement with eating.  With plan to try GI cocktail.  Will obtain single troponin, chest x-ray and reassess.  Patient is low risk for PE by Wells and PERC negative.   Differential includes all life-threatening causes for chest pain. This includes but is not exclusive to acute coronary syndrome, aortic dissection, pulmonary embolism, cardiac tamponade, community-acquired pneumonia, pericarditis, musculoskeletal chest wall pain, etc.  Labs and CXR unremarkable. Suspect MSK etiology but will have the patient follow with PCP. With constant symptoms plan for no enzyme trending in the ED.   At this time, I do not feel there is any  life-threatening condition present. I have reviewed and discussed all results (EKG, imaging, lab, urine as appropriate), exam findings with patient. I have reviewed nursing notes and appropriate previous records.  I feel the patient is safe to be discharged home without further emergent workup. Discussed usual and customary return precautions. Patient and family (if present) verbalize understanding and are comfortable with this plan.  Patient will follow-up with their primary care provider. If they do not have a primary care provider, information for follow-up has been provided to them. All questions have been answered.   ____________________________________________  FINAL CLINICAL IMPRESSION(S) / ED DIAGNOSES  Final diagnoses:  Precordial chest pain     MEDICATIONS GIVEN DURING THIS VISIT:  Medications  gi cocktail (Maalox,Lidocaine,Donnatal) (30 mLs Oral Given 01/18/17 0759)     NEW OUTPATIENT MEDICATIONS STARTED DURING THIS VISIT:  None   Note:  This document was prepared using Dragon voice recognition software and may include unintentional dictation errors.  Alona BeneJoshua Zeki Bedrosian, MD Emergency Medicine    Timithy Arons, Arlyss RepressJoshua G, MD 01/18/17 1515

## 2017-01-18 NOTE — ED Triage Notes (Signed)
Pt c/o cp x 1 week. C/o worsening tightness since yesterday. Also reports elevated BP and sweating.

## 2017-01-18 NOTE — Discharge Instructions (Signed)

## 2017-05-16 ENCOUNTER — Encounter (HOSPITAL_COMMUNITY): Payer: Self-pay | Admitting: Emergency Medicine

## 2017-05-16 ENCOUNTER — Emergency Department (HOSPITAL_COMMUNITY)
Admission: EM | Admit: 2017-05-16 | Discharge: 2017-05-16 | Disposition: A | Discharge status: Home / Self Care | Payer: BLUE CROSS/BLUE SHIELD | Attending: Emergency Medicine | Admitting: Emergency Medicine

## 2017-05-16 DIAGNOSIS — J329 Chronic sinusitis, unspecified: Secondary | ICD-10-CM

## 2017-05-16 DIAGNOSIS — J019 Acute sinusitis, unspecified: Secondary | ICD-10-CM | POA: Insufficient documentation

## 2017-05-16 DIAGNOSIS — Z87891 Personal history of nicotine dependence: Secondary | ICD-10-CM | POA: Insufficient documentation

## 2017-05-16 DIAGNOSIS — J069 Acute upper respiratory infection, unspecified: Secondary | ICD-10-CM

## 2017-05-16 DIAGNOSIS — Z79899 Other long term (current) drug therapy: Secondary | ICD-10-CM | POA: Insufficient documentation

## 2017-05-16 MED ORDER — PROMETHAZINE-DM 6.25-15 MG/5ML PO SYRP: 5.0000 mL | ORAL_SOLUTION | Freq: Four times a day (QID) | ORAL | 0 refills | Status: DC | PRN

## 2017-05-16 MED ORDER — DEXAMETHASONE 4 MG PO TABS: 4.0000 mg | ORAL_TABLET | Freq: Two times a day (BID) | ORAL | 0 refills | Status: DC

## 2017-05-16 MED ORDER — LORATADINE-PSEUDOEPHEDRINE ER 5-120 MG PO TB12: 1.0000 | ORAL_TABLET | Freq: Two times a day (BID) | ORAL | 0 refills | Status: DC

## 2017-05-16 NOTE — ED Provider Notes (Signed)
[facility] EMERGENCY DEPARTMENT Provider Note   CSN: [id number] Arrival date & time: 05/16/17  4098     History   Chief Complaint Chief Complaint  Patient presents with  . Nasal Congestion    HPI Lori Chan is a 22 y.o. female.  The history is provided by the patient.  URI   This is a new problem. The current episode started more than 1 week ago. The problem has been gradually worsening. There has been no fever. Associated symptoms include congestion, ear pain, headaches, rhinorrhea, sinus pain, sneezing and sore throat. Pertinent negatives include no chest pain, no abdominal pain, no diarrhea, no vomiting, no dysuria, no neck pain, no cough and no wheezing. Treatments tried: OTC cold medication.    History reviewed. No pertinent past medical history.  There are no active problems to display for this patient.   History reviewed. No pertinent surgical history.  OB History    No data available       Home Medications    Prior to Admission medications   Medication Sig Start Date End Date Taking? Authorizing Provider  fexofenadine (ALLEGRA) 180 MG tablet Take 180 mg by mouth daily.   Yes [provider]  Pseudoeph-Doxylamine-DM-APAP 30-6.25-15-325 MG CAPS Take 2 capsules by mouth at bedtime.   Yes [provider]  Pseudoephedrine-APAP-DM (DAYQUIL PO) Take 2 capsules by mouth every 12 (twelve) hours.   Yes [provider]    Family History History reviewed. No pertinent family history.  Social History Social History   Tobacco Use  . Smoking status: Former Games developer  . Smokeless tobacco: Never Used  Substance Use Topics  . Alcohol use: No  . Drug use: No     Allergies   Ibuprofen and Sulfa antibiotics   Review of Systems Review of Systems  Constitutional: Negative for activity change.       All ROS Neg except as noted in HPI  HENT: Positive for congestion, ear pain, rhinorrhea, sinus pain, sneezing and sore throat. Negative for  nosebleeds.   Eyes: Negative for photophobia and discharge.  Respiratory: Negative for cough, shortness of breath and wheezing.   Cardiovascular: Negative for chest pain and palpitations.  Gastrointestinal: Negative for abdominal pain, blood in stool, diarrhea and vomiting.  Genitourinary: Negative for dysuria, frequency and hematuria.  Musculoskeletal: Negative for arthralgias, back pain and neck pain.  Skin: Negative.   Neurological: Positive for headaches. Negative for dizziness, seizures and speech difficulty.  Psychiatric/Behavioral: Negative for confusion and hallucinations.     Physical Exam Updated Vital Signs BP 90/70 (BP Location: Right Arm)   Pulse 76   Temp 98.4 F (36.9 C) (Oral)   Resp 16   Ht 5\' 3"  (1.6 m)   Wt 108.9 kg (240 lb)   LMP 05/09/2017   SpO2 100%   BMI 42.51 kg/m   Physical Exam  Constitutional: She is oriented to person, place, and time. She appears well-developed and well-nourished.  Non-toxic appearance.  HENT:  Head: Normocephalic.  Right Ear: Tympanic membrane and external ear normal.  Left Ear: Tympanic membrane and external ear normal.  Pain to percussion over the sinuses. Nasal congestion present.  Eyes: EOM and lids are normal. Pupils are equal, round, and reactive to light.  Neck: Normal range of motion. Neck supple. Carotid bruit is not present.  Cardiovascular: Normal rate, regular rhythm, normal heart sounds, intact distal pulses and normal pulses.  Pulmonary/Chest: Breath sounds normal. No respiratory distress.  Abdominal: Soft. Bowel sounds are normal. There  is no tenderness. There is no guarding.  Musculoskeletal: Normal range of motion.  Lymphadenopathy:       Head (right side): No submandibular adenopathy present.       Head (left side): No submandibular adenopathy present.    She has no cervical adenopathy.  Neurological: She is alert and oriented to person, place, and time. She has normal strength. No cranial nerve deficit or  sensory deficit.  Skin: Skin is warm and dry.  Psychiatric: She has a normal mood and affect. Her speech is normal.  Nursing note and vitals reviewed.    ED Treatments / Results  Labs (all labs ordered are listed, but only abnormal results are displayed) Labs Reviewed - No data to display  EKG  EKG Interpretation None       Radiology No results found.  Procedures Procedures (including critical care time)  Medications Ordered in ED Medications - No data to display   Initial Impression / Assessment and Plan / ED Course  I have reviewed the triage vital signs and the nursing notes.  Pertinent labs & imaging results that were available during my care of the patient were reviewed by me and considered in my medical decision making (see chart for details).       Final Clinical Impressions(s) / ED Diagnoses MDM  Vital signs reviewed.  Examination is consistent with sinusitis and upper respiratory infection.  Patient will be treated with Decadron, Claritin-D, and promethazine DM.  I have instructed the patient on the importance of good handwashing.  I have instructed the patient on the importance of good hydration.  No evidence for any mastoiditis.  No peritonsillar abscess noted, and no evidence for any systemic infection related to the sinus infections.  No periorbital cellulitis.  Patient will follow up with the primary physician or return to the emergency department if any changes or problems.   Final diagnoses:  Sinusitis, unspecified chronicity, unspecified location  Upper respiratory tract infection, unspecified type    ED Discharge Orders        Ordered    dexamethasone (DECADRON) 4 MG tablet  2 times daily with meals     05/16/17 1117    loratadine-pseudoephedrine (CLARITIN-D 12 HOUR) 5-120 MG tablet  2 times daily     05/16/17 1117    promethazine-dextromethorphan (PROMETHAZINE-DM) 6.25-15 MG/5ML syrup  4 times daily PRN     05/16/17 1117       Ivery QualeBryant,  Masiah Lewing, PA-C 05/16/17 1122    Bethann BerkshireZammit, Joseph, MD 05/16/17 1337

## 2017-05-16 NOTE — Discharge Instructions (Signed)
Please increase your fluids such as water, juices, Gatorade's.  Please wash hands frequently.  Have all members in your home wash hands frequently.  Use Decadron and Claritin-D every 12 hours.  Use Tylenol extra strength for fever or aching.  Use promethazine DM for cough.This medication may cause drowsiness. Please do not drink, drive, or participate in activity that requires concentration while taking this medication.

## 2017-05-16 NOTE — ED Triage Notes (Signed)
Pt reports scratchy throat with pressure in face for about a week with slight cough.

## 2017-07-16 ENCOUNTER — Other Ambulatory Visit: Payer: Self-pay

## 2017-07-16 ENCOUNTER — Emergency Department: Payer: Self-pay

## 2017-07-16 ENCOUNTER — Emergency Department
Admission: EM | Admit: 2017-07-16 | Discharge: 2017-07-16 | Disposition: A | Discharge status: Home / Self Care | Payer: Self-pay | Attending: Emergency Medicine | Admitting: Emergency Medicine

## 2017-07-16 ENCOUNTER — Encounter: Payer: Self-pay | Admitting: Emergency Medicine

## 2017-07-16 DIAGNOSIS — J40 Bronchitis, not specified as acute or chronic: Secondary | ICD-10-CM | POA: Insufficient documentation

## 2017-07-16 DIAGNOSIS — R05 Cough: Secondary | ICD-10-CM

## 2017-07-16 DIAGNOSIS — Z79899 Other long term (current) drug therapy: Secondary | ICD-10-CM | POA: Insufficient documentation

## 2017-07-16 DIAGNOSIS — F17200 Nicotine dependence, unspecified, uncomplicated: Secondary | ICD-10-CM | POA: Insufficient documentation

## 2017-07-16 DIAGNOSIS — R059 Cough, unspecified: Secondary | ICD-10-CM

## 2017-07-16 MED ORDER — PREDNISONE 10 MG (21) PO TBPK: ORAL_TABLET | ORAL | 0 refills | Status: DC

## 2017-07-16 MED ORDER — ALBUTEROL SULFATE HFA 108 (90 BASE) MCG/ACT IN AERS: 2.0000 | INHALATION_SPRAY | Freq: Four times a day (QID) | RESPIRATORY_TRACT | 0 refills | Status: DC | PRN

## 2017-07-16 MED ORDER — BENZONATATE 100 MG PO CAPS: 100.0000 mg | ORAL_CAPSULE | Freq: Four times a day (QID) | ORAL | 0 refills | Status: DC | PRN

## 2017-07-16 MED ORDER — PREDNISONE 20 MG PO TABS
60.0000 mg | ORAL_TABLET | Freq: Once | ORAL | Status: AC
  Administered 2017-07-16: 60 mg via ORAL
  Filled 2017-07-16: qty 3

## 2017-07-16 MED ORDER — BENZONATATE 100 MG PO CAPS
100.0000 mg | ORAL_CAPSULE | Freq: Once | ORAL | Status: AC
  Administered 2017-07-16: 100 mg via ORAL
  Filled 2017-07-16: qty 1

## 2017-07-16 NOTE — ED Provider Notes (Signed)
[facility] Emergency Department Provider Note   ____________________________________________   First MD Initiated Contact with Patient 07/16/17 0109     (approximate)  I have reviewed the triage vital signs and the nursing notes.   HISTORY  Chief Complaint Cough    HPI Lori Chan is a 22 y.o. female who comes into the hospital today with a cough.  She reports that she is unable to sleep and it hurts to cough.  Also she has pain in her left ear.  She states that the symptoms started 2 days ago but the ear pain started today.  The patient has been taking Robitussin at home but she states it has not been helping.  The patient states that she has been coughing up occasionally clear phlegm but sometimes it has some green tint to it.  The patient had some posttussive emesis yesterday.  She denies any fevers.  The patient's son is sick from daycare but the patient states that this is been going on for too long and she wanted to get it checked out.  History reviewed. No pertinent past medical history.  There are no active problems to display for this patient.   History reviewed. No pertinent surgical history.  Prior to Admission medications   Medication Sig Start Date End Date Taking? Authorizing Provider  albuterol (PROVENTIL HFA;VENTOLIN HFA) 108 (90 Base) MCG/ACT inhaler Inhale 2 puffs into the lungs every 6 (six) hours as needed. 07/16/17   Rebecka Apley, MD  benzonatate (TESSALON PERLES) 100 MG capsule Take 1 capsule (100 mg total) by mouth every 6 (six) hours as needed for cough. 07/16/17   Rebecka Apley, MD  dexamethasone (DECADRON) 4 MG tablet Take 1 tablet (4 mg total) by mouth 2 (two) times daily with a meal. 05/16/17   Ivery Quale, PA-C  fexofenadine (ALLEGRA) 180 MG tablet Take 180 mg by mouth daily.    [provider]  loratadine-pseudoephedrine (CLARITIN-D 12 HOUR) 5-120 MG tablet Take 1 tablet by mouth 2 (two) times daily.  05/16/17   Ivery Quale, PA-C  predniSONE (STERAPRED UNI-PAK 21 TAB) 10 MG (21) TBPK tablet Take 6 tabs on day 1 Take 5 tabs on day 2 Take 4 tabs on day 3 Take 3 tabs on day 4 Take 2 tabs on day 5 Take 1 tab on day 6 07/16/17   Rebecka Apley, MD  promethazine-dextromethorphan (PROMETHAZINE-DM) 6.25-15 MG/5ML syrup Take 5 mLs by mouth 4 (four) times daily as needed for cough. 05/16/17   Ivery Quale, PA-C  Pseudoeph-Doxylamine-DM-APAP 30-6.25-15-325 MG CAPS Take 2 capsules by mouth at bedtime.    [provider]  Pseudoephedrine-APAP-DM (DAYQUIL PO) Take 2 capsules by mouth every 12 (twelve) hours.    [provider]    Allergies Ibuprofen and Sulfa antibiotics  No family history on file.  Social History Social History   Tobacco Use  . Smoking status: Current Every Day Smoker  . Smokeless tobacco: Never Used  Substance Use Topics  . Alcohol use: No  . Drug use: No    Review of Systems  Constitutional: No fever/chills Eyes: No visual changes. ENT: left ear pain Cardiovascular: chest pain with cough Respiratory: cough Gastrointestinal: No abdominal pain.  No nausea, no vomiting.  No diarrhea.  No constipation. Genitourinary: Negative for dysuria. Musculoskeletal: Negative for back pain. Skin: Negative for rash. Neurological: Negative for headaches, focal weakness or numbness.   ____________________________________________   PHYSICAL EXAM:  VITAL SIGNS: ED Triage Vitals  Enc  Vitals Group     BP 07/16/17 0026 113/70     Pulse Rate 07/16/17 0026 97     Resp 07/16/17 0026 20     Temp 07/16/17 0026 98.2 F (36.8 C)     Temp Source 07/16/17 0026 Oral     SpO2 07/16/17 0026 99 %     Weight 07/16/17 0025 240 lb (108.9 kg)     Height 07/16/17 0025  (1.6 m)     Head Circumference --      Peak Flow --      Pain Score 07/16/17 0025 0     Pain Loc --      Pain Edu? --      Excl. in GC? --    Constitutional: Alert and oriented. Well  appearing and in mild distress. Ears: right TM gray flat and dull, left TM with some effusion with no erythema Eyes: Conjunctivae are normal. PERRL. EOMI. Head: Atraumatic. Nose: No congestion/rhinnorhea. Mouth/Throat: Mucous membranes are moist.  Oropharynx non-erythematous. Cardiovascular: Normal rate, regular rhythm. Grossly normal heart sounds.  Good peripheral circulation. Respiratory: Normal respiratory effort.  No retractions. Lungs CTAB. Gastrointestinal: Soft and nontender. No distention. Positive bowel sounds Musculoskeletal: No lower extremity tenderness nor edema.  Neurologic:  Normal speech and language.  Skin:  Skin is warm, dry and intact.  Psychiatric: Mood and affect are normal.   ____________________________________________   LABS (all labs ordered are listed, but only abnormal results are displayed)  Labs Reviewed - No data to display ____________________________________________  EKG  none ____________________________________________  RADIOLOGY  ED MD interpretation:  CXR: Mild central airways thickening as may be seen with reactive airways or bronchitis. No focal pulmonary infiltrate.  Official radiology report(s): Dg Chest 2 View  Result Date: 07/16/2017 CLINICAL DATA:  Productive cough EXAM: CHEST - 2 VIEW COMPARISON:  01/18/2017 FINDINGS: Mild central airways thickening. No focal opacity or pleural effusion. Normal heart size. No pneumothorax. IMPRESSION: Mild central airways thickening as may be seen with reactive airways or bronchitis. No focal pulmonary infiltrate. Electronically Signed   By: Jasmine Pang M.D.   On: 07/16/2017 01:48    ____________________________________________   PROCEDURES  Procedure(s) performed: None  Procedures  Critical Care performed: No  ____________________________________________   INITIAL IMPRESSION / ASSESSMENT AND PLAN / ED COURSE  As part of my medical decision making, I reviewed the following data within  the electronic MEDICAL RECORD NUMBER Notes from prior ED visits and [place] Controlled Substance Database   This is a 22 year old female who comes into the hospital today with a cough and some left-sided ear pain.  My differential diagnosis includes bronchitis, costochondritis, otitis media, otitis externa, pneumonia  The patient's vital signs are unremarkable and she is in no acute distress.  She has no tenderness to palpation of her chest and her lungs sound clear.  I will send the patient for chest x-ray looking for possible pneumonia.  I will give the patient some benzonatate and prednisone with the concern of bronchitis.  She is not wheezing at this time but I will reassess the patient.  The patient's chest x-ray does not show a pneumonia.  I will discharge the patient to home to have a follow-up with her primary care physician or with the acute care clinic.      ____________________________________________   FINAL CLINICAL IMPRESSION(S) / ED DIAGNOSES  Final diagnoses:  Cough  Bronchitis     ED Discharge Orders        Ordered  benzonatate (TESSALON PERLES) 100 MG capsule  Every 6 hours PRN     07/16/17 0201    predniSONE (STERAPRED UNI-PAK 21 TAB) 10 MG (21) TBPK tablet     07/16/17 0201    albuterol (PROVENTIL HFA;VENTOLIN HFA) 108 (90 Base) MCG/ACT inhaler  Every 6 hours PRN     07/16/17 0201       Note:  This document was prepared using Dragon voice recognition software and may include unintentional dictation errors.    Rebecka Apley, MD 07/16/17 [phone]

## 2017-07-16 NOTE — ED Notes (Addendum)
Pt reports a cough for several days. cig smoker.  No fever .  Pt states she is taking otc meds without relief.  States coughing up green phlegm.  Pt has left earache.  Pt alert.

## 2017-07-16 NOTE — ED Triage Notes (Signed)
Patient ambulatory to triage with steady gait, without difficulty or distress noted; pt reports prod cough green sputum since Saturday

## 2017-07-16 NOTE — Discharge Instructions (Signed)
Please follow up with the acute care clinic °

## 2018-02-20 ENCOUNTER — Encounter (HOSPITAL_COMMUNITY): Payer: Self-pay | Admitting: Emergency Medicine

## 2018-02-20 ENCOUNTER — Emergency Department (HOSPITAL_COMMUNITY)
Admission: EM | Admit: 2018-02-20 | Discharge: 2018-02-20 | Disposition: A | Discharge status: Home / Self Care | Payer: Self-pay | Attending: Emergency Medicine | Admitting: Emergency Medicine

## 2018-02-20 ENCOUNTER — Other Ambulatory Visit: Payer: Self-pay

## 2018-02-20 DIAGNOSIS — F17210 Nicotine dependence, cigarettes, uncomplicated: Secondary | ICD-10-CM | POA: Insufficient documentation

## 2018-02-20 DIAGNOSIS — A599 Trichomoniasis, unspecified: Secondary | ICD-10-CM

## 2018-02-20 DIAGNOSIS — N760 Acute vaginitis: Secondary | ICD-10-CM | POA: Insufficient documentation

## 2018-02-20 DIAGNOSIS — B9689 Other specified bacterial agents as the cause of diseases classified elsewhere: Secondary | ICD-10-CM | POA: Insufficient documentation

## 2018-02-20 DIAGNOSIS — A5901 Trichomonal vulvovaginitis: Secondary | ICD-10-CM | POA: Insufficient documentation

## 2018-02-20 LAB — URINALYSIS, ROUTINE W REFLEX MICROSCOPIC
BILIRUBIN URINE: NEGATIVE
Glucose, UA: NEGATIVE mg/dL
Hgb urine dipstick: NEGATIVE
Ketones, ur: 20 mg/dL — AB
Nitrite: NEGATIVE
PH: 7 (ref 5.0–8.0)
PROTEIN: NEGATIVE mg/dL
SPECIFIC GRAVITY, URINE: 1.023 (ref 1.005–1.030)

## 2018-02-20 LAB — POC URINE PREG, ED: PREG TEST UR: NEGATIVE

## 2018-02-20 LAB — WET PREP, GENITAL
Sperm: NONE SEEN
Yeast Wet Prep HPF POC: NONE SEEN

## 2018-02-20 MED ORDER — METRONIDAZOLE 500 MG PO TABS: 500.0000 mg | ORAL_TABLET | Freq: Two times a day (BID) | ORAL | 0 refills | Status: DC

## 2018-02-20 NOTE — Discharge Instructions (Signed)
Your test is positive for both bacterial vaginosis (which I suspected) but also for trichomonas, which is an STD.  The same medicine prescribed will treat both infections.  Your wife will need to be treated for trichomonas as this infection is sexually transmitted.  You should avoid sex until you have both completed the antibiotics.  If she does not have a doctor she can go to the health department for this problem.

## 2018-02-20 NOTE — ED Provider Notes (Signed)
[facility] EMERGENCY DEPARTMENT Provider Note   CSN: [id number] Arrival date & time: 02/20/18  4098     History   Chief Complaint Chief Complaint  Patient presents with  . Vaginal Irritation    HPI Lori Chan is a 22 y.o. female lesbian in a committed relationship with her wife presenting with vaginal irritation and itching which has been present for months.  She reports a scant amount of increased white discharge, denies abdominal pain, n/v fevers or low back pain but does endorse intermittent episodes of burning pain with urination.  She denies increased urinary frequency and hematuria.  She is not bothered by symptoms while on her period.  She has tried warm epsom salt soaks which help with itching during the soak, but it immediately comes back.  She has found no other alleviators.  Denies any external lesions or rash.  The history is provided by the patient.    History reviewed. No pertinent past medical history.  There are no active problems to display for this patient.   Past Surgical History:  Procedure Laterality Date  . WISDOM TOOTH EXTRACTION       OB History   None      Home Medications    Prior to Admission medications   Medication Sig Start Date End Date Taking? Authorizing Provider  albuterol (PROVENTIL HFA;VENTOLIN HFA) 108 (90 Base) MCG/ACT inhaler Inhale 2 puffs into the lungs every 6 (six) hours as needed. Patient not taking: Reported on 02/20/2018 07/16/17   Rebecka Apley, MD  benzonatate (TESSALON PERLES) 100 MG capsule Take 1 capsule (100 mg total) by mouth every 6 (six) hours as needed for cough. Patient not taking: Reported on 02/20/2018 07/16/17   Rebecka Apley, MD  dexamethasone (DECADRON) 4 MG tablet Take 1 tablet (4 mg total) by mouth 2 (two) times daily with a meal. Patient not taking: Reported on 02/20/2018 05/16/17   Ivery Quale, PA-C  loratadine-pseudoephedrine (CLARITIN-D 12 HOUR) 5-120 MG tablet Take 1 tablet by mouth 2 (two)  times daily. Patient not taking: Reported on 02/20/2018 05/16/17   Ivery Quale, PA-C  metroNIDAZOLE (FLAGYL) 500 MG tablet Take 1 tablet (500 mg total) by mouth 2 (two) times daily. 02/20/18   Genesis Novosad, Raynelle Fanning, PA-C  predniSONE (STERAPRED UNI-PAK 21 TAB) 10 MG (21) TBPK tablet Take 6 tabs on day 1 Take 5 tabs on day 2 Take 4 tabs on day 3 Take 3 tabs on day 4 Take 2 tabs on day 5 Take 1 tab on day 6 Patient not taking: Reported on 02/20/2018 07/16/17   Rebecka Apley, MD  promethazine-dextromethorphan (PROMETHAZINE-DM) 6.25-15 MG/5ML syrup Take 5 mLs by mouth 4 (four) times daily as needed for cough. Patient not taking: Reported on 02/20/2018 05/16/17   Ivery Quale, PA-C    Family History No family history on file.  Social History Social History   Tobacco Use  . Smoking status: Current Every Day Smoker  . Smokeless tobacco: Never Used  Substance Use Topics  . Alcohol use: No  . Drug use: No     Allergies   Ibuprofen and Sulfa antibiotics   Review of Systems Review of Systems  Constitutional: Negative for chills and fever.  HENT: Negative for congestion and sore throat.   Eyes: Negative.   Respiratory: Negative for chest tightness and shortness of breath.   Cardiovascular: Negative for chest pain.  Gastrointestinal: Negative for abdominal pain, nausea and vomiting.  Genitourinary: Positive for dysuria and vaginal discharge. Negative for frequency,  genital sores, hematuria, menstrual problem and pelvic pain.  Musculoskeletal: Negative for arthralgias, joint swelling and neck pain.  Skin: Negative.  Negative for rash and wound.  Neurological: Negative for dizziness, weakness, light-headedness, numbness and headaches.  Psychiatric/Behavioral: Negative.      Physical Exam Updated Vital Signs BP 126/84 (BP Location: Left Arm)   Pulse 81   Temp 98.4 F (36.9 C) (Oral)   Ht 5\' 3"  (1.6 m)   Wt 108.9 kg   LMP 01/19/2018   SpO2 100%   BMI 42.51 kg/m   Physical Exam    Constitutional: She appears well-developed and well-nourished.  HENT:  Head: Normocephalic and atraumatic.  Eyes: Conjunctivae are normal.  Neck: Normal range of motion.  Cardiovascular: Normal rate, regular rhythm, normal heart sounds and intact distal pulses.  Pulmonary/Chest: Effort normal and breath sounds normal. She has no wheezes.  Abdominal: Soft. Bowel sounds are normal. There is no tenderness.  Genitourinary: Uterus normal. There is no rash or tenderness on the right labia. There is no rash or tenderness on the left labia. Uterus is not tender. Cervix exhibits no motion tenderness and no discharge. Right adnexum displays no mass, no tenderness and no fullness. Left adnexum displays no mass, no tenderness and no fullness. Vaginal discharge found.  Musculoskeletal: Normal range of motion.  Neurological: She is alert.  Skin: Skin is warm and dry.  Psychiatric: She has a normal mood and affect.  Nursing note and vitals reviewed.    ED Treatments / Results  Labs (all labs ordered are listed, but only abnormal results are displayed) Labs Reviewed  WET PREP, GENITAL - Abnormal; Notable for the following components:      Result Value   Trich, Wet Prep PRESENT (*)    Clue Cells Wet Prep HPF POC PRESENT (*)    WBC, Wet Prep HPF POC MANY (*)    All other components within normal limits  URINALYSIS, ROUTINE W REFLEX MICROSCOPIC - Abnormal; Notable for the following components:   APPearance HAZY (*)    Ketones, ur 20 (*)    Leukocytes, UA SMALL (*)    Bacteria, UA RARE (*)    All other components within normal limits  POC URINE PREG, ED  GC/CHLAMYDIA PROBE AMP ([place]) NOT AT [facility]    EKG None  Radiology No results found.  Procedures Procedures (including critical care time)  Medications Ordered in ED Medications - No data to display   Initial Impression / Assessment and Plan / ED Course  I have reviewed the triage vital signs and the nursing notes.  Pertinent  labs & imaging results that were available during my care of the patient were reviewed by me and considered in my medical decision making (see chart for details).     Patient with both trichomonas and bacterial vaginosis which correlates with her symptoms.  She does not have any exam findings to suggest GC or chlamydia.  These cultures are pending however.  She was prescribed Flagyl, discussed that her wife will need treatment also and to not have sex until they have both been treated and are symptom-free.  PRN follow-up anticipated.  Final Clinical Impressions(s) / ED Diagnoses   Final diagnoses:  Trichomonas infection  Bacterial vaginosis    ED Discharge Orders         Ordered    metroNIDAZOLE (FLAGYL) 500 MG tablet  2 times daily     02/20/18 1045           Avanni Turnbaugh,  Raynelle FanningJulie, PA-C 02/20/18 1046    Bethann BerkshireZammit, Joseph, MD 02/20/18 1447

## 2018-02-20 NOTE — ED Triage Notes (Signed)
Pt C/O vaginal pressure when she participates in sexual acts with her partner. Pt reports fowl odor and burning with urination. Pt denies any abnormal discharge.

## 2018-02-21 LAB — GC/CHLAMYDIA PROBE AMP (~~LOC~~) NOT AT ARMC
Chlamydia: NEGATIVE
NEISSERIA GONORRHEA: NEGATIVE

## 2018-03-30 ENCOUNTER — Emergency Department (HOSPITAL_COMMUNITY)
Admission: EM | Admit: 2018-03-30 | Discharge: 2018-03-30 | Disposition: A | Discharge status: Home / Self Care | Payer: Self-pay | Attending: Emergency Medicine | Admitting: Emergency Medicine

## 2018-03-30 ENCOUNTER — Encounter (HOSPITAL_COMMUNITY): Payer: Self-pay | Admitting: *Deleted

## 2018-03-30 ENCOUNTER — Other Ambulatory Visit: Payer: Self-pay

## 2018-03-30 DIAGNOSIS — N760 Acute vaginitis: Secondary | ICD-10-CM | POA: Insufficient documentation

## 2018-03-30 DIAGNOSIS — B3731 Acute candidiasis of vulva and vagina: Secondary | ICD-10-CM

## 2018-03-30 DIAGNOSIS — N941 Unspecified dyspareunia: Secondary | ICD-10-CM | POA: Insufficient documentation

## 2018-03-30 DIAGNOSIS — B373 Candidiasis of vulva and vagina: Secondary | ICD-10-CM | POA: Insufficient documentation

## 2018-03-30 DIAGNOSIS — F17200 Nicotine dependence, unspecified, uncomplicated: Secondary | ICD-10-CM | POA: Insufficient documentation

## 2018-03-30 DIAGNOSIS — B9689 Other specified bacterial agents as the cause of diseases classified elsewhere: Secondary | ICD-10-CM | POA: Insufficient documentation

## 2018-03-30 LAB — WET PREP, GENITAL
SPERM: NONE SEEN
TRICH WET PREP: NONE SEEN

## 2018-03-30 MED ORDER — FLUCONAZOLE 150 MG PO TABS
150.0000 mg | ORAL_TABLET | Freq: Once | ORAL | Status: AC
  Administered 2018-03-30: 150 mg via ORAL
  Filled 2018-03-30: qty 1

## 2018-03-30 MED ORDER — METRONIDAZOLE 500 MG PO TABS: 500.0000 mg | ORAL_TABLET | Freq: Two times a day (BID) | ORAL | 0 refills | Status: DC

## 2018-03-30 NOTE — ED Provider Notes (Signed)
[facility] EMERGENCY DEPARTMENT Provider Note   CSN: [id number] Arrival date & time: 03/30/18  9323     History   Chief Complaint Chief Complaint  Patient presents with  . Vaginal Pain  Spouse at bedside for history and physical at patient request  HPI Lori Chan is a 23 y.o. female.  The history is provided by the patient and the spouse.  Vaginal Pain  This is a new problem. The current episode started more than 2 days ago. The problem occurs daily. The problem has been gradually worsening. The symptoms are aggravated by intercourse. Nothing relieves the symptoms.   She presents for vaginal pain.  She reports over the past week she has been having increasing vaginal pain worse with intercourse.  No fever/vomiting.  No abdominal pain.  No dysuria.  She has had discharge.  No bleeding.  She was diagnosed last month with trichomonas and completed her course of Flagyl.  PMH-none Past Surgical History:  Procedure Laterality Date  . WISDOM TOOTH EXTRACTION       OB History   No obstetric history on file.      Home Medications    Prior to Admission medications   Medication Sig Start Date End Date Taking? Authorizing Provider  albuterol (PROVENTIL HFA;VENTOLIN HFA) 108 (90 Base) MCG/ACT inhaler Inhale 2 puffs into the lungs every 6 (six) hours as needed. Patient not taking: Reported on 02/20/2018 07/16/17   Rebecka Apley, MD    Family History No family history on file.  Social History Social History   Tobacco Use  . Smoking status: Current Every Day Smoker  . Smokeless tobacco: Never Used  Substance Use Topics  . Alcohol use: No  . Drug use: No     Allergies   Ibuprofen and Sulfa antibiotics   Review of Systems Review of Systems  Constitutional: Negative for fever.  Gastrointestinal: Negative for vomiting.  Genitourinary: Positive for dyspareunia and vaginal pain. Negative for dysuria and vaginal bleeding.  All other systems reviewed and are  negative.    Physical Exam Updated Vital Signs BP 114/82   Pulse 66   Temp 97.8 F (36.6 C)   Resp 16   LMP 03/23/2018   SpO2 100%   Physical Exam CONSTITUTIONAL: Well developed/well nourished anxious HEAD: Normocephalic/atraumatic EYES: EOMI/PERRL ENMT: Mucous membranes moist NECK: supple no meningeal signs CV: S1/S2 noted, no murmurs/rubs/gallops noted LUNGS: Lungs are clear to auscultation bilaterallys ABDOMEN: soft, nontender, no rebound or guarding, bowel sounds noted throughout abdomen GU:no cva tenderness Whitish discharge noted.  No lacerations, no visible trauma.  No lesions noted.  No bleeding noted.  No CMT. Nurse Herbert Seta present for entire pelvic exam NEURO: Pt is awake/alert/appropriate, moves all extremitiesx4.  No facial droop.   EXTREMITIES: pulses normal/equal, full ROM SKIN: warm, color normal PSYCH: anxious  ED Treatments / Results  Labs (all labs ordered are listed, but only abnormal results are displayed) Labs Reviewed  WET PREP, GENITAL - Abnormal; Notable for the following components:      Result Value   Yeast Wet Prep HPF POC PRESENT (*)    Clue Cells Wet Prep HPF POC PRESENT (*)    WBC, Wet Prep HPF POC MANY (*)    All other components within normal limits  GC/CHLAMYDIA PROBE AMP ([place]) NOT AT [facility]    EKG None  Radiology No results found.  Procedures Procedures  Medications Ordered in ED Medications  fluconazole (DIFLUCAN) tablet 150 mg (has no administration in time range)  Initial Impression / Assessment and Plan / ED Course  I have reviewed the triage vital signs and the nursing notes.  Pertinent labs  results that were available during my care of the patient were reviewed by me and considered in my medical decision making (see chart for details).     6:49 AM Patient presents for increasing vaginal pain with sexual activity over the past week.  She has already been treated for trichomonas a month ago and reports  she completed the treatment.  Labs are pending at this time 7:11 AM Labs reveal vaginitis due to yeast as well as BV.  No signs of trichomonas.  Patient will be started on Diflucan and Flagyl Patient will be discharged home.  Patient had negative pregnancy test last month and she does not feel she is currently pregnant..  She had no dysuria to suggest UTI. She has been referred to GYN Final Clinical Impressions(s) / ED Diagnoses   Final diagnoses:  Vaginal candidiasis  Bacterial vaginosis    ED Discharge Orders         Ordered    metroNIDAZOLE (FLAGYL) 500 MG tablet  2 times daily     03/30/18 0659           Zadie Rhine, MD 03/30/18 [phone]

## 2018-03-30 NOTE — ED Triage Notes (Signed)
Pt c/o vaginal pain with sexual activity that started a week ago, denies any discharge,

## 2018-03-31 LAB — GC/CHLAMYDIA PROBE AMP (~~LOC~~) NOT AT ARMC
CHLAMYDIA, DNA PROBE: NEGATIVE
Neisseria Gonorrhea: NEGATIVE

## 2018-04-09 ENCOUNTER — Encounter (HOSPITAL_COMMUNITY): Payer: Self-pay

## 2018-04-09 ENCOUNTER — Other Ambulatory Visit: Payer: Self-pay

## 2018-04-09 ENCOUNTER — Emergency Department (HOSPITAL_COMMUNITY)
Admission: EM | Admit: 2018-04-09 | Discharge: 2018-04-09 | Disposition: A | Discharge status: Home / Self Care | Payer: Self-pay | Attending: Emergency Medicine | Admitting: Emergency Medicine

## 2018-04-09 DIAGNOSIS — F17210 Nicotine dependence, cigarettes, uncomplicated: Secondary | ICD-10-CM | POA: Insufficient documentation

## 2018-04-09 DIAGNOSIS — N898 Other specified noninflammatory disorders of vagina: Secondary | ICD-10-CM | POA: Insufficient documentation

## 2018-04-09 LAB — URINALYSIS, ROUTINE W REFLEX MICROSCOPIC
BILIRUBIN URINE: NEGATIVE
GLUCOSE, UA: NEGATIVE mg/dL
HGB URINE DIPSTICK: NEGATIVE
KETONES UR: NEGATIVE mg/dL
Leukocytes, UA: NEGATIVE
Nitrite: NEGATIVE
PH: 7 (ref 5.0–8.0)
Protein, ur: NEGATIVE mg/dL
SPECIFIC GRAVITY, URINE: 1.005 (ref 1.005–1.030)

## 2018-04-09 LAB — PREGNANCY, URINE: Preg Test, Ur: NEGATIVE

## 2018-04-09 LAB — WET PREP, GENITAL
Clue Cells Wet Prep HPF POC: NONE SEEN
SPERM: NONE SEEN
Trich, Wet Prep: NONE SEEN
Yeast Wet Prep HPF POC: NONE SEEN

## 2018-04-09 NOTE — ED Triage Notes (Signed)
Pt states she started taking flagyl a week ago. Took last dose and states she now has discharge. Believes it is a yeast infection. Has not taken anything to treat it.

## 2018-04-09 NOTE — Discharge Instructions (Signed)
You were seen in the ED today with vaginal discharge. Call the PCP listed. They see people without insurance and can help with getting you to a Gynecologist. Return to the ED with any abdominal pain, fever, chills, or other worsening symptoms.

## 2018-04-09 NOTE — ED Provider Notes (Signed)
Emergency Department Provider Note   I have reviewed the triage vital signs and the nursing notes.   HISTORY  Chief Complaint Vaginal Discharge   HPI Lori Chan is a 23 y.o. female returns to the emergency department for evaluation of vaginal discharge and discomfort during intercourse.  Her last menstrual cycle was March 23, 2018.  She completed her course of Flagyl and was given Diflucan in the emergency department after being diagnosed with bacterial vaginosis and yeast during her last ED visit.  She states that she completed the Flagyl but her vaginal discharge returned.  She has been sexually active.  She denies any abdominal pain, nausea, vomiting.  No fevers or chills.  No associated symptoms or other modifying factors.  History reviewed. No pertinent past medical history.  There are no active problems to display for this patient.   Past Surgical History:  Procedure Laterality Date  . WISDOM TOOTH EXTRACTION     Allergies Ibuprofen and Sulfa antibiotics  No family history on file.  Social History Social History   Tobacco Use  . Smoking status: Current Every Day Smoker  . Smokeless tobacco: Never Used  Substance Use Topics  . Alcohol use: No  . Drug use: No    Review of Systems  Constitutional: No fever/chills Eyes: No visual changes. ENT: No sore throat. Cardiovascular: Denies chest pain. Respiratory: Denies shortness of breath. Gastrointestinal: No abdominal pain.  No nausea, no vomiting.  No diarrhea.  No constipation. Genitourinary: Negative for dysuria. Positive vaginal discharge.  Musculoskeletal: Negative for back pain. Skin: Negative for rash. Neurological: Negative for headaches, focal weakness or numbness.  10-point ROS otherwise negative.  ____________________________________________   PHYSICAL EXAM:  VITAL SIGNS: ED Triage Vitals [04/09/18 0706]  Enc Vitals Group     BP 109/61     Pulse Rate 72     Resp 13     Temp 98.2 F  (36.8 C)     Temp Source Oral     SpO2 100 %     Weight 230 lb (104.3 kg)     Height 5\' 3"  (1.6 m)   Constitutional: Alert and oriented. Well appearing and in no acute distress. Eyes: Conjunctivae are normal.  Head: Atraumatic. Nose: No congestion/rhinnorhea. Mouth/Throat: Mucous membranes are moist.  Neck: No stridor.  Cardiovascular: Normal rate, regular rhythm.  Respiratory: Normal respiratory effort.   Gastrointestinal: No distention.  Genitourinary: Exam performed with nurse chaperone. Speculum exam with white discharge. No vaginal bleeding. Normal cervix.  Musculoskeletal: No gross deformities of extremities. Neurologic:  Normal speech and language.  Skin:  Skin is warm, dry and intact. No rash noted.  ____________________________________________   LABS (all labs ordered are listed, but only abnormal results are displayed)  Labs Reviewed  WET PREP, GENITAL - Abnormal; Notable for the following components:      Result Value   WBC, Wet Prep HPF POC MANY (*)    All other components within normal limits  URINALYSIS, ROUTINE W REFLEX MICROSCOPIC - Abnormal; Notable for the following components:   Color, Urine STRAW (*)    All other components within normal limits  PREGNANCY, URINE  GC/CHLAMYDIA PROBE AMP ([place]) NOT AT [facility]   ____________________________________________   PROCEDURES  Procedure(s) performed:   Procedures  None ____________________________________________   INITIAL IMPRESSION / ASSESSMENT AND PLAN / ED COURSE  Pertinent labs & imaging results that were available during my care of the patient were reviewed by me and considered in my medical decision making (  see chart for details).  Patient returns to the emergency department with vaginal discharge.  Reviewed her recent gonorrhea/chlamydia testing which was negative.  At that time she had positive yeast and clue cells with elevated WBCs on wet prep.  Plan for repeat swabs.  Low suspicion for  PID or TOA.   08:15 AM Urine analysis and wet prep results reviewed.  No acute findings.  Many WBCs but no clue cells, trichomonas, yeast.  Gonorrhea and Chlamydia testing is pending but patient had negative testing approximately 1 week ago so no empiric treatment.  I have provided contact information for local free clinic as well as local OB/GYN practice.  ____________________________________________  FINAL CLINICAL IMPRESSION(S) / ED DIAGNOSES  Final diagnoses:  Vaginal discharge    Note:  This document was prepared using Dragon voice recognition software and may include unintentional dictation errors.  Alona BeneJoshua Long, MD Emergency Medicine    Long, Arlyss RepressJoshua G, MD 04/09/18 [phone]

## 2018-04-10 LAB — GC/CHLAMYDIA PROBE AMP (~~LOC~~) NOT AT ARMC
CHLAMYDIA, DNA PROBE: NEGATIVE
Neisseria Gonorrhea: NEGATIVE

## 2018-04-16 ENCOUNTER — Other Ambulatory Visit: Payer: Self-pay

## 2018-04-16 ENCOUNTER — Encounter (HOSPITAL_COMMUNITY): Payer: Self-pay | Admitting: *Deleted

## 2018-04-16 ENCOUNTER — Emergency Department (HOSPITAL_COMMUNITY)
Admission: EM | Admit: 2018-04-16 | Discharge: 2018-04-16 | Disposition: A | Discharge status: Home / Self Care | Payer: Self-pay | Attending: Emergency Medicine | Admitting: Emergency Medicine

## 2018-04-16 DIAGNOSIS — Z882 Allergy status to sulfonamides status: Secondary | ICD-10-CM | POA: Insufficient documentation

## 2018-04-16 DIAGNOSIS — F17200 Nicotine dependence, unspecified, uncomplicated: Secondary | ICD-10-CM | POA: Insufficient documentation

## 2018-04-16 DIAGNOSIS — N3001 Acute cystitis with hematuria: Secondary | ICD-10-CM

## 2018-04-16 DIAGNOSIS — N3011 Interstitial cystitis (chronic) with hematuria: Secondary | ICD-10-CM | POA: Insufficient documentation

## 2018-04-16 LAB — URINALYSIS, ROUTINE W REFLEX MICROSCOPIC
BILIRUBIN URINE: NEGATIVE
Glucose, UA: NEGATIVE mg/dL
KETONES UR: NEGATIVE mg/dL
Nitrite: NEGATIVE
PH: 5 (ref 5.0–8.0)
Protein, ur: NEGATIVE mg/dL
Specific Gravity, Urine: 1.021 (ref 1.005–1.030)

## 2018-04-16 LAB — PREGNANCY, URINE: Preg Test, Ur: NEGATIVE

## 2018-04-16 MED ORDER — CEPHALEXIN 500 MG PO CAPS: 500.0000 mg | ORAL_CAPSULE | Freq: Two times a day (BID) | ORAL | 0 refills | Status: DC

## 2018-04-16 MED ORDER — CEPHALEXIN 500 MG PO CAPS
500.0000 mg | ORAL_CAPSULE | Freq: Once | ORAL | Status: AC
  Administered 2018-04-16: 500 mg via ORAL
  Filled 2018-04-16: qty 1

## 2018-04-16 NOTE — ED Provider Notes (Addendum)
[facility] EMERGENCY DEPARTMENT Provider Note   CSN: [id number] Arrival date & time: 04/16/18  0125     History   Chief Complaint Chief Complaint  Patient presents with  . Dysuria    HPI Lori Chan is a 23 y.o. female.  The history is provided by the patient.  Dysuria  Pain quality:  Burning Pain severity:  Moderate Onset quality:  Gradual Duration:  5 days Timing:  Intermittent Progression:  Worsening Chronicity:  New Recent urinary tract infections: no   Relieved by:  None tried Worsened by:  Nothing Urinary symptoms: frequent urination   Associated symptoms: no fever, no flank pain, no vaginal discharge and no vomiting   Risk factors: no recurrent urinary tract infections    Patient reports dysuria for up to 5 days.  No fevers or vomiting.  No vaginal complaints. No history of urologic surgery  PMH-none Past Surgical History:  Procedure Laterality Date  . WISDOM TOOTH EXTRACTION       OB History   No obstetric history on file.      Home Medications    Prior to Admission medications   Medication Sig Start Date End Date Taking? Authorizing Provider  albuterol (PROVENTIL HFA;VENTOLIN HFA) 108 (90 Base) MCG/ACT inhaler Inhale 2 puffs into the lungs every 6 (six) hours as needed. Patient not taking: Reported on 02/20/2018 07/16/17   Rebecka Apley, MD  cephALEXin (KEFLEX) 500 MG capsule Take 1 capsule (500 mg total) by mouth 2 (two) times daily. 04/16/18   Zadie Rhine, MD    Family History History reviewed. No pertinent family history.  Social History Social History   Tobacco Use  . Smoking status: Current Every Day Smoker  . Smokeless tobacco: Never Used  Substance Use Topics  . Alcohol use: No  . Drug use: No     Allergies   Ibuprofen and Sulfa antibiotics   Review of Systems Review of Systems  Constitutional: Negative for fever.  Gastrointestinal: Negative for vomiting.  Genitourinary: Positive for dysuria. Negative for  flank pain, vaginal bleeding and vaginal discharge.  All other systems reviewed and are negative.    Physical Exam Updated Vital Signs BP (!) 86/72 (BP Location: Right Arm)   Pulse 71   Temp 98 F (36.7 C) (Oral)   Resp 18   Ht 1.6 m (5\' 3" )   Wt 104 kg   LMP 03/23/2018   SpO2 100%   BMI 40.61 kg/m   Physical Exam  CONSTITUTIONAL: Well developed/well nourished HEAD: Normocephalic/atraumatic EYES: EOMI ENMT: Mucous membranes moist NECK: supple no meningeal signs SPINE/BACK:entire spine nontender CV: S1/S2 noted, no murmurs/rubs/gallops noted LUNGS: Lungs are clear to auscultation bilaterally, no apparent distress ABDOMEN: soft, mild suprapubic tenderness, no rebound or guarding, bowel sounds noted throughout abdomen GU:no cva tenderness NEURO: Pt is awake/alert/appropriate, moves all extremitiesx4. EXTREMITIES:   full ROM SKIN: warm, color normal PSYCH: no abnormalities of mood noted, alert and oriented to situation  ED Treatments / Results  Labs (all labs ordered are listed, but only abnormal results are displayed) Labs Reviewed  URINALYSIS, ROUTINE W REFLEX MICROSCOPIC - Abnormal; Notable for the following components:      Result Value   APPearance CLOUDY (*)    Hgb urine dipstick SMALL (*)    Leukocytes, UA LARGE (*)    RBC / HPF >50 (*)    WBC, UA >50 (*)    Bacteria, UA FEW (*)    All other components within normal limits  PREGNANCY, URINE  EKG None  Radiology No results found.  Procedures Procedures (including critical care time)  Medications Ordered in ED Medications  cephALEXin (KEFLEX) capsule 500 mg (500 mg Oral Given 04/16/18 0311)     Initial Impression / Assessment and Plan / ED Course  I have reviewed the triage vital signs and the nursing notes.  Pertinent labs  results that were available during my care of the patient were reviewed by me and considered in my medical decision making (see chart for details).     Patient with UTI.   She is well-appearing.  She is not septic appearing.  Will discharge home. Allergy to sulfa, therefore will use Keflex Final Clinical Impressions(s) / ED Diagnoses   Final diagnoses:  Acute cystitis with hematuria    ED Discharge Orders         Ordered    cephALEXin (KEFLEX) 500 MG capsule  2 times daily     04/16/18 0302           Zadie Rhine, MD 04/16/18 3888    Zadie Rhine, MD 04/16/18 [phone]

## 2018-04-16 NOTE — ED Triage Notes (Signed)
Pt c/o burning with urination x 5 days

## 2019-06-26 ENCOUNTER — Other Ambulatory Visit: Payer: Self-pay

## 2019-06-26 ENCOUNTER — Encounter (HOSPITAL_COMMUNITY): Payer: Self-pay

## 2019-06-26 ENCOUNTER — Emergency Department (HOSPITAL_COMMUNITY)
Admission: EM | Admit: 2019-06-26 | Discharge: 2019-06-26 | Disposition: A | Discharge status: Home / Self Care | Payer: Self-pay | Attending: Emergency Medicine | Admitting: Emergency Medicine

## 2019-06-26 DIAGNOSIS — B9689 Other specified bacterial agents as the cause of diseases classified elsewhere: Secondary | ICD-10-CM | POA: Insufficient documentation

## 2019-06-26 DIAGNOSIS — F17210 Nicotine dependence, cigarettes, uncomplicated: Secondary | ICD-10-CM | POA: Insufficient documentation

## 2019-06-26 DIAGNOSIS — N760 Acute vaginitis: Secondary | ICD-10-CM | POA: Insufficient documentation

## 2019-06-26 DIAGNOSIS — Z79899 Other long term (current) drug therapy: Secondary | ICD-10-CM | POA: Insufficient documentation

## 2019-06-26 DIAGNOSIS — B373 Candidiasis of vulva and vagina: Secondary | ICD-10-CM | POA: Insufficient documentation

## 2019-06-26 DIAGNOSIS — B3731 Acute candidiasis of vulva and vagina: Secondary | ICD-10-CM

## 2019-06-26 LAB — URINALYSIS, ROUTINE W REFLEX MICROSCOPIC
Bilirubin Urine: NEGATIVE
Glucose, UA: NEGATIVE mg/dL
Hgb urine dipstick: NEGATIVE
Ketones, ur: NEGATIVE mg/dL
Nitrite: NEGATIVE
Protein, ur: NEGATIVE mg/dL
Specific Gravity, Urine: 1.012 (ref 1.005–1.030)
pH: 7 (ref 5.0–8.0)

## 2019-06-26 LAB — WET PREP, GENITAL
Sperm: NONE SEEN
Trich, Wet Prep: NONE SEEN

## 2019-06-26 LAB — POC URINE PREG, ED: Preg Test, Ur: NEGATIVE

## 2019-06-26 MED ORDER — METRONIDAZOLE 500 MG PO TABS: 500.0000 mg | ORAL_TABLET | Freq: Two times a day (BID) | ORAL | 0 refills | Status: DC

## 2019-06-26 MED ORDER — FLUCONAZOLE 200 MG PO TABS: 200.0000 mg | ORAL_TABLET | ORAL | 0 refills | Status: AC

## 2019-06-26 MED ORDER — FLUCONAZOLE 100 MG PO TABS
200.0000 mg | ORAL_TABLET | Freq: Once | ORAL | Status: AC
  Administered 2019-06-26: 11:00:00 200 mg via ORAL
  Filled 2019-06-26: qty 2

## 2019-06-26 NOTE — Discharge Instructions (Addendum)
Please establish care with and follow-up with a primary care doctor.  I recommend full STI testing at some point in the future.   You are diagnosed with bacterial vaginosis and a fungal yeast infection called Candida vaginitis.  Please take the antibiotic and antifungal medication as prescribed.  He will take the antibiotic, Flagyl, twice daily for the next week.  He will take the antifungal medication fluconazole once in 1 week.  You are given 1 tablet today.

## 2019-06-26 NOTE — ED Triage Notes (Signed)
Pt reports burning with urination since Monday

## 2019-06-26 NOTE — ED Provider Notes (Signed)
[facility] EMERGENCY DEPARTMENT Provider Note   CSN: [id number] Arrival date & time: 06/26/19  0636     History Chief Complaint  Patient presents with  . Dysuria    Lori Chan is a 24 y.o. female.  HPI Patient is a 24 year old female with no past medical history no surgical history resented today with dysuria since Monday.  She is in a monogamous relationship with a female and states that her wife is asymptomatic.  Patient states that she has been diagnosed with BV once and yeast infection once.  She states that this feels somewhat like a yeast infection to her.  She also states that she has had a urinary tract infection in the past little over 1 year ago.  States that she is not prone to vaginal/GU issues.  Patient states that she has mild itching, very small amount of white discharge, and burning with urination.  She also endorses some frequency and urgency.  She denies any back pain, fevers, chills, nausea, vomiting.  She denies any fatigue weakness or malaise.  She states she feels otherwise well denies any cough cold congestion, chest pain, abdominal pain.      History reviewed. No pertinent past medical history.  There are no problems to display for this patient.   Past Surgical History:  Procedure Laterality Date  . WISDOM TOOTH EXTRACTION       OB History   No obstetric history on file.     No family history on file.  Social History   Tobacco Use  . Smoking status: Current Every Day Smoker    Packs/day: 0.50    Types: Cigarettes  . Smokeless tobacco: Never Used  Substance Use Topics  . Alcohol use: No  . Drug use: No    Home Medications Prior to Admission medications   Medication Sig Start Date End Date Taking? Authorizing Provider  albuterol (PROVENTIL HFA;VENTOLIN HFA) 108 (90 Base) MCG/ACT inhaler Inhale 2 puffs into the lungs every 6 (six) hours as needed. Patient not taking: Reported on 02/20/2018 07/16/17   Rebecka Apley, MD  cephALEXin  (KEFLEX) 500 MG capsule Take 1 capsule (500 mg total) by mouth 2 (two) times daily. 04/16/18   Zadie Rhine, MD  fluconazole (DIFLUCAN) 200 MG tablet Take 1 tablet (200 mg total) by mouth once a week for 1 dose. 06/26/19 06/27/19  Gailen Shelter, PA  metroNIDAZOLE (FLAGYL) 500 MG tablet Take 1 tablet (500 mg total) by mouth 2 (two) times daily. 06/26/19   Gailen Shelter, PA    Allergies    Ibuprofen and Sulfa antibiotics  Review of Systems   Review of Systems  Constitutional: Negative for fever.  HENT: Negative for congestion.   Respiratory: Negative for shortness of breath.   Cardiovascular: Negative for chest pain.  Gastrointestinal: Negative for abdominal distention.  Genitourinary: Positive for dysuria, frequency, urgency and vaginal discharge.  Neurological: Negative for dizziness and headaches.    Physical Exam Updated Vital Signs BP (!) 126/58 (BP Location: Right Arm)   Pulse 68   Temp 98 F (36.7 C) (Oral)   Resp 16   Ht 5\' 3"  (1.6 m)   Wt 108.9 kg   LMP 06/04/2019   SpO2 100%   BMI 42.51 kg/m   Physical Exam Vitals and nursing note reviewed.  Constitutional:      General: She is not in acute distress.    Appearance: Normal appearance. She is not ill-appearing.  HENT:     Head: Normocephalic and  atraumatic.     Nose: Nose normal.  Eyes:     General: No scleral icterus.       Right eye: No discharge.        Left eye: No discharge.     Conjunctiva/sclera: Conjunctivae normal.  Cardiovascular:     Rate and Rhythm: Normal rate and regular rhythm.     Pulses: Normal pulses.     Heart sounds: Normal heart sounds.  Pulmonary:     Effort: Pulmonary effort is normal. No respiratory distress.  Abdominal:     Palpations: Abdomen is soft.     Tenderness: There is no abdominal tenderness.  Genitourinary:    Comments: Vulva without lesions or abnormality Vaginal canal without abnormal discharge or lesion Cervix appears normal, is closed No adnexal tenderness or  CMT Musculoskeletal:     Cervical back: Normal range of motion.     Right lower leg: No edema.     Left lower leg: No edema.  Skin:    General: Skin is warm and dry.     Capillary Refill: Capillary refill takes less than 2 seconds.  Neurological:     Mental Status: She is alert and oriented to person, place, and time. Mental status is at baseline.  Psychiatric:        Mood and Affect: Mood normal.        Behavior: Behavior normal.     ED Results / Procedures / Treatments   Labs (all labs ordered are listed, but only abnormal results are displayed) Labs Reviewed  WET PREP, GENITAL - Abnormal; Notable for the following components:      Result Value   Yeast Wet Prep HPF POC PRESENT (*)    Clue Cells Wet Prep HPF POC PRESENT (*)    WBC, Wet Prep HPF POC MANY (*)    All other components within normal limits  URINALYSIS, ROUTINE W REFLEX MICROSCOPIC - Abnormal; Notable for the following components:   APPearance HAZY (*)    Leukocytes,Ua LARGE (*)    Bacteria, UA RARE (*)    All other components within normal limits  URINE CULTURE  POC URINE PREG, ED  GC/CHLAMYDIA PROBE AMP () NOT AT [facility]    EKG None  Radiology No results found.  Procedures Procedures (including critical care time)  Medications Ordered in ED Medications  fluconazole (DIFLUCAN) tablet 200 mg (has no administration in time range)    ED Course  I have reviewed the triage vital signs and the nursing notes.  Pertinent labs & imaging results that were available during my care of the patient were reviewed by me and considered in my medical decision making (see chart for details).  Clinical Course as of Jun 25 953  Fri Jun 26, 2019  8338 Urinalysis is unremarkable with rare bacteria and leukocytes present.  This is unlikely to be UTI.  She has no CVA tenderness.  She is afebrile.  No pelvic tenderness no suprapubic tenderness.   [WF]    Clinical Course User Index [WF] Gailen Shelter, [place]    MDM Rules/Calculators/A&P                      Patient is a 24 year old female with no sniffing past medical history presented today with dysuria, frequency, urgency, small amount of white vaginal discharge since Monday.  She states that she has no other symptoms.  She denies any abdominal pain, vaginal pain.  Her physical exam is notable for no  CMT or adnexal tenderness.   Doubt ectopic pregnancy as her hCG is negative.  Doubt PID or torsion as she has no CMT or adnexal tenderness.   On my review of EMR patient does have a history of trichomonas.  She has declined a full STI work-up today I recommend she follow-up with PCP or health department and have a full STI screening conducted.  I discussed this case with my attending physician who cosigned this note including patient's presenting symptoms, physical exam, and planned diagnostics and interventions. Attending physician stated agreement with plan or made changes to plan which were implemented.   Patient with few bacteria and large leukocytes.  Suspect that this is a result of the inflammation from BV/yeast infection.  Will treat BV and yeast infection with fluconazole and Flagyl.  Will recommend patient follow-up with PCP.  Will culture urine patient's cell phone was updated in system to ensure that she can call for any abnormal results.  The medical records were personally reviewed by myself. I personally reviewed all lab results and interpreted all imaging studies and either concurred with their official read or contacted radiology for clarification.   This patient appears reasonably screened and I doubt any other medical condition requiring further workup, evaluation, or treatment in the ED at this time prior to discharge.   Patient's vitals are WNL apart from vital sign abnormalities discussed above, patient is in NAD, and able to ambulate in the ED at their baseline and able to tolerate PO.  Pain has been managed or a plan has been  made for home management and has no complaints prior to discharge. Patient is comfortable with above plan and for discharge at this time. All questions were answered prior to disposition. Results from the ER workup discussed with the patient face to face and all questions answered to the best of my ability. The patient is safe for discharge with strict return precautions. Patient appears safe for discharge with appropriate follow-up. Conveyed my impression with the patient and they voiced understanding and are agreeable to plan.   An After Visit Summary was printed and given to the patient.  Portions of this note were generated with [employer]. Dictation errors may occur despite best attempts at proofreading.    Final Clinical Impression(s) / ED Diagnoses Final diagnoses:  Bacterial vaginosis  Candida vaginitis    Rx / DC Orders ED Discharge Orders         Ordered    metroNIDAZOLE (FLAGYL) 500 MG tablet  2 times daily     06/26/19 0952    fluconazole (DIFLUCAN) 200 MG tablet  Weekly     06/26/19 0952           Tedd Sias, PA 06/26/19 2458    Milton Ferguson, MD 06/26/19 1040

## 2019-06-28 LAB — URINE CULTURE: Culture: <10000 — AB

## 2019-06-29 LAB — GC/CHLAMYDIA PROBE AMP (~~LOC~~) NOT AT ARMC
Chlamydia: NEGATIVE
Comment: NEGATIVE
Comment: NORMAL
Neisseria Gonorrhea: NEGATIVE

## 2019-08-25 NOTE — Progress Notes (Signed)
PCP:  Lori Chan, No Pcp Per   Chief Complaint  Lori Chan presents with   Gynecologic Exam    pain with intercourse   Vaginal Discharge    excessive discharge, itchiness/irritation, no odor x 1 month   Urinary Tract Infection    frequency x 1 month     HPI:      Lori Chan is a 24 y.o. No obstetric history on file. whose LMP was Lori Chan's last menstrual period was 08/23/2019 (exact date)., presents today for her NP annual examination.  Her menses are regular every 28-30 days, lasting 7 days.  Dysmenorrhea moderate, occurring first 1-2 days of flow. She does have intermenstrual bleeding.  Sex activity: has female partner.  Last Pap: not recent; no hx of abn Hx of STDs: chlamydia, trichomonas; Neg STD testing 4/21  Has had increased d/c with irritation, no odor for past month. Hx of recurrent BV and yeast and was on abx for BV before sx started. No meds to treat this time. Not taking probiotics. Partner without sx. Toys cleaned well after use. Vag sx also causing dyspareunia. Has urinary frequency/urgency for past month, drinks caffeine. Has dysuria after sex.   There is no FH of breast cancer. There is no FH of ovarian cancer. The Lori Chan does do self-breast exams.  Tobacco use: The Lori Chan denies current or previous tobacco use. Alcohol use: none Daily marijuana use.  Exercise: not active  She does not get adequate calcium and Vitamin D in her diet. Gardasil completed.   Past Medical History:  Diagnosis Date   BV (bacterial vaginosis)    Yeast vaginitis     Past Surgical History:  Procedure Laterality Date   WISDOM TOOTH EXTRACTION      Family History  Problem Relation Age of Onset   Diabetes Mother    Diabetes Maternal Grandmother     Social History   Socioeconomic History   Marital status: Married    Spouse name: Not on file   Number of children: Not on file   Years of education: Not on file   Highest education level: Not on file    Occupational History   Not on file  Tobacco Use   Smoking status: Current Every Day Smoker    Packs/day: 0.50    Types: Cigarettes   Smokeless tobacco: Never Used  Substance and Sexual Activity   Alcohol use: No   Drug use: No   Sexual activity: Yes    Birth control/protection: None  Other Topics Concern   Not on file  Social History Narrative   Not on file   Social Determinants of Health   Financial Resource Strain:    Difficulty of Paying Living Expenses:   Food Insecurity:    Worried About Programme researcher, broadcasting/film/video in the Last Year:    Barista in the Last Year:   Transportation Needs:    Freight forwarder (Medical):    Lack of Transportation (Non-Medical):   Physical Activity:    Days of Exercise per Week:    Minutes of Exercise per Session:   Stress:    Feeling of Stress :   Social Connections:    Frequency of Communication with Friends and Family:    Frequency of Social Gatherings with Friends and Family:    Attends Religious Services:    Active Member of Clubs or Organizations:    Attends Banker Meetings:    Marital Status:   Intimate Partner Violence:  Fear of Current or Ex-Partner:    Emotionally Abused:    Physically Abused:    Sexually Abused:      Current Outpatient Medications:    fluconazole (DIFLUCAN) 150 MG tablet, Take 1 tablet (150 mg total) by mouth once for 1 dose. Then repeat after 3 days, Disp: 2 tablet, Rfl: 0   ROS:  Review of Systems  Constitutional: Negative for fatigue, fever and unexpected weight change.  Respiratory: Negative for cough, shortness of breath and wheezing.   Cardiovascular: Negative for chest pain, palpitations and leg swelling.  Gastrointestinal: Negative for blood in stool, constipation, diarrhea, nausea and vomiting.  Endocrine: Negative for cold intolerance, heat intolerance and polyuria.  Genitourinary: Positive for dyspareunia, dysuria, frequency, urgency and  vaginal discharge. Negative for flank pain, genital sores, hematuria, menstrual problem, pelvic pain, vaginal bleeding and vaginal pain.  Musculoskeletal: Negative for back pain, joint swelling and myalgias.  Skin: Negative for rash.  Neurological: Negative for dizziness, syncope, light-headedness, numbness and headaches.  Hematological: Negative for adenopathy.  Psychiatric/Behavioral: Negative for agitation, confusion, sleep disturbance and suicidal ideas. The Lori Chan is not nervous/anxious.   BREAST: No symptoms   Objective: BP 100/72    Ht 5\' 3"  (1.6 m)    Wt 257 lb (116.6 kg)    LMP 08/23/2019 (Exact Date)    BMI 45.53 kg/m    Physical Exam Constitutional:      Appearance: She is well-developed.  Genitourinary:     Vulva, cervix, uterus, right adnexa and left adnexa normal.     No vulval lesion or tenderness noted.     Vaginal bleeding present.     No vaginal discharge, erythema or tenderness.     No cervical polyp.     Uterus is not enlarged or tender.     No right or left adnexal mass present.     Right adnexa not tender.     Left adnexa not tender.  Neck:     Thyroid: No thyromegaly.  Cardiovascular:     Rate and Rhythm: Normal rate and regular rhythm.     Heart sounds: Normal heart sounds. No murmur.  Pulmonary:     Effort: Pulmonary effort is normal.     Breath sounds: Normal breath sounds.  Chest:     Breasts:        Right: No mass, nipple discharge, skin change or tenderness.        Left: No mass, nipple discharge, skin change or tenderness.  Abdominal:     Palpations: Abdomen is soft.     Tenderness: There is no abdominal tenderness. There is no guarding.  Musculoskeletal:        General: Normal range of motion.     Cervical back: Normal range of motion.  Neurological:     General: No focal deficit present.     Mental Status: She is alert and oriented to person, place, and time.     Cranial Nerves: No cranial nerve deficit.  Skin:    General: Skin is  warm and dry.  Psychiatric:        Mood and Affect: Mood normal.        Behavior: Behavior normal.        Thought Content: Thought content normal.        Judgment: Judgment normal.  Vitals reviewed.     Results: Results for orders placed or performed in visit on 08/26/19 (from the past 24 hour(s))  POCT Wet Prep with KOH  Status: Abnormal   Collection Time: 08/26/19 10:01 AM  Result Value Ref Range   Trichomonas, UA Negative    Clue Cells Wet Prep HPF POC neg    Epithelial Wet Prep HPF POC     Yeast Wet Prep HPF POC pos    Bacteria Wet Prep HPF POC     RBC Wet Prep HPF POC     WBC Wet Prep HPF POC     KOH Prep POC Negative Negative  POCT Urinalysis Dipstick     Status: Normal   Collection Time: 08/26/19 10:01 AM  Result Value Ref Range   Color, UA yellow    Clarity, UA clear    Glucose, UA Negative Negative   Bilirubin, UA neg    Ketones, UA neg    Spec Grav, UA 1.015 1.010 - 1.025   Blood, UA large    pH, UA 5.0 5.0 - 8.0   Protein, UA Negative Negative   Urobilinogen, UA     Nitrite, UA neg    Leukocytes, UA Negative Negative   Appearance     Odor    PT WITH MENSTRUAL BLEEDING  Assessment/Plan: Encounter for annual routine gynecological examination  Cervical cancer screening - Plan: Cytology - PAP  Screening for STD (sexually transmitted disease) - Plan: Cytology - PAP  Yeast vaginitis - Plan: POCT Wet Prep with KOH, fluconazole (DIFLUCAN) 150 MG tablet; Pos sx/wet prep. Rx diflucan. Add probiotics. Partner needs BV tx if has sx to prevent spreading.   Urinary frequency - Plan: POCT Urinalysis Dipstick; Neg UA. D/c caffeine to see if sx improve. F/u prn  Meds ordered this encounter  Medications   fluconazole (DIFLUCAN) 150 MG tablet    Sig: Take 1 tablet (150 mg total) by mouth once for 1 dose. Then repeat after 3 days    Dispense:  2 tablet    Refill:  0    Order Specific Question:   Supervising Provider    Answer:   Gae Dry [[id number]]              GYN counsel adequate intake of calcium and vitamin D, diet and exercise     F/U  Return in about 1 year (around 08/25/2020).  Lucan Riner B. Abdulkarim Eberlin, PA-C 08/26/2019 10:05 AM

## 2019-08-25 NOTE — Patient Instructions (Signed)
I value your feedback and entrusting us with your care. If you get a [place] patient survey, I would appreciate you taking the time to let us know about your experience today. Thank you!  As of February 26, 2019, your lab results will be released to your MyChart immediately, before I even have a chance to see them. Please give me time to review them and contact you if there are any abnormalities. Thank you for your patience.  

## 2019-08-26 ENCOUNTER — Ambulatory Visit (INDEPENDENT_AMBULATORY_CARE_PROVIDER_SITE_OTHER): Payer: 59 | Admitting: Obstetrics and Gynecology

## 2019-08-26 ENCOUNTER — Other Ambulatory Visit: Payer: Self-pay

## 2019-08-26 ENCOUNTER — Other Ambulatory Visit (HOSPITAL_COMMUNITY)
Admission: RE | Admit: 2019-08-26 | Discharge: 2019-08-26 | Disposition: A | Discharge status: Home / Self Care | Payer: 59 | Source: Ambulatory Visit | Attending: Obstetrics and Gynecology | Admitting: Obstetrics and Gynecology

## 2019-08-26 ENCOUNTER — Encounter: Payer: Self-pay | Admitting: Obstetrics and Gynecology

## 2019-08-26 VITALS — BP 100/72 | Ht 63.0 in | Wt 257.0 lb

## 2019-08-26 DIAGNOSIS — B373 Candidiasis of vulva and vagina: Secondary | ICD-10-CM

## 2019-08-26 DIAGNOSIS — Z01419 Encounter for gynecological examination (general) (routine) without abnormal findings: Secondary | ICD-10-CM | POA: Diagnosis not present

## 2019-08-26 DIAGNOSIS — R350 Frequency of micturition: Secondary | ICD-10-CM | POA: Diagnosis not present

## 2019-08-26 DIAGNOSIS — Z124 Encounter for screening for malignant neoplasm of cervix: Secondary | ICD-10-CM

## 2019-08-26 DIAGNOSIS — Z113 Encounter for screening for infections with a predominantly sexual mode of transmission: Secondary | ICD-10-CM

## 2019-08-26 DIAGNOSIS — B3731 Acute candidiasis of vulva and vagina: Secondary | ICD-10-CM

## 2019-08-26 LAB — POCT URINALYSIS DIPSTICK
Bilirubin, UA: NEGATIVE
Glucose, UA: NEGATIVE
Ketones, UA: NEGATIVE
Leukocytes, UA: NEGATIVE
Nitrite, UA: NEGATIVE
Protein, UA: NEGATIVE
Spec Grav, UA: 1.015 (ref 1.010–1.025)
pH, UA: 5 (ref 5.0–8.0)

## 2019-08-26 LAB — POCT WET PREP WITH KOH
Clue Cells Wet Prep HPF POC: NEGATIVE
KOH Prep POC: NEGATIVE
Trichomonas, UA: NEGATIVE
Yeast Wet Prep HPF POC: POSITIVE

## 2019-08-26 MED ORDER — FLUCONAZOLE 150 MG PO TABS: 150.0000 mg | ORAL_TABLET | Freq: Once | ORAL | 0 refills | Status: AC

## 2019-08-27 LAB — CYTOLOGY - PAP
Chlamydia: NEGATIVE
Comment: NEGATIVE
Comment: NORMAL
Diagnosis: NEGATIVE
Neisseria Gonorrhea: NEGATIVE

## 2019-09-10 ENCOUNTER — Other Ambulatory Visit: Payer: 59 | Admitting: Adult Health

## 2019-10-11 ENCOUNTER — Ambulatory Visit
Admission: RE | Admit: 2019-10-11 | Discharge: 2019-10-11 | Disposition: A | Discharge status: Home / Self Care | Payer: 59 | Source: Ambulatory Visit | Attending: Emergency Medicine | Admitting: Emergency Medicine

## 2019-10-11 ENCOUNTER — Other Ambulatory Visit: Payer: Self-pay

## 2019-10-11 VITALS — BP 123/74 | HR 85 | Temp 98.3°F | Resp 18

## 2019-10-11 DIAGNOSIS — N898 Other specified noninflammatory disorders of vagina: Secondary | ICD-10-CM | POA: Diagnosis present

## 2019-10-11 MED ORDER — METRONIDAZOLE 500 MG PO TABS: 500.0000 mg | ORAL_TABLET | Freq: Two times a day (BID) | ORAL | 0 refills | Status: DC

## 2019-10-11 MED ORDER — FLUCONAZOLE 200 MG PO TABS: ORAL_TABLET | ORAL | 0 refills | Status: DC

## 2019-10-11 NOTE — ED Triage Notes (Signed)
Pt presents with vaginal discharge and discomfort X 1 week.

## 2019-10-11 NOTE — Discharge Instructions (Signed)
Vaginal self-swab obtained.  We will follow up with you regarding abnormal results Prescribed metronidazole 500 mg twice daily for 7 days (do not take while consuming alcohol and/or if breastfeeding) Prescribed diflucan 200 mg once daily and then second dose 72 hours later Take medications as prescribed and to completion If tests results are positive, please abstain from sexual activity until you and your partner(s) have been treated Follow up with PCP as needed Return here or go to ER if you have any new or worsening symptoms fever, chills, nausea, vomiting, abdominal or pelvic pain, painful intercourse, vaginal discharge, vaginal bleeding, persistent symptoms despite treatment, etc..Marland Kitchen

## 2019-10-11 NOTE — ED Provider Notes (Signed)
[facility]   [id number] 10/11/19 Arrival Time: 0944   [id number] DISCHARGE  SUBJECTIVE:  Adore Kithcart is a 24 y.o. female who presents with complaints of vaginal discharge, itching, and irritation x 1 week.  Admits to sex with wife prior to symptoms.  Describes discharge as thick and white.  Has not tried OTC medication.  Denies aggravating factors.  She reports similar symptoms in the past and was diagnosed with yeast and BV.  She denies fever, chills, nausea, vomiting, abdominal or pelvic pain, urinary symptoms, vaginal odor, vaginal bleeding, dyspareunia, vaginal rashes or lesions.   Patient's last menstrual period was 09/22/2019.  ROS: As per HPI.  All other pertinent ROS negative.     Past Medical History:  Diagnosis Date  . BV (bacterial vaginosis)   . Yeast vaginitis    Past Surgical History:  Procedure Laterality Date  . WISDOM TOOTH EXTRACTION     Allergies  Allergen Reactions  . Ibuprofen Hives  . Sulfa Antibiotics Hives   No current facility-administered medications on file prior to encounter.   No current outpatient medications on file prior to encounter.    Social History   Socioeconomic History  . Marital status: Married    Spouse name: Not on file  . Number of children: Not on file  . Years of education: Not on file  . Highest education level: Not on file  Occupational History  . Not on file  Tobacco Use  . Smoking status: Current Every Day Smoker    Packs/day: 0.50    Types: Cigarettes  . Smokeless tobacco: Never Used  Vaping Use  . Vaping Use: Never used  Substance and Sexual Activity  . Alcohol use: No  . Drug use: No  . Sexual activity: Yes    Birth control/protection: None  Other Topics Concern  . Not on file  Social History Narrative  . Not on file   Social Determinants of Health   Financial Resource Strain:   . Difficulty of Paying Living Expenses:   Food Insecurity:   . Worried About Programme researcher, broadcasting/film/video in the Last  Year:   . Barista in the Last Year:   Transportation Needs:   . Freight forwarder (Medical):   Marland Kitchen Lack of Transportation (Non-Medical):   Physical Activity:   . Days of Exercise per Week:   . Minutes of Exercise per Session:   Stress:   . Feeling of Stress :   Social Connections:   . Frequency of Communication with Friends and Family:   . Frequency of Social Gatherings with Friends and Family:   . Attends Religious Services:   . Active Member of Clubs or Organizations:   . Attends Banker Meetings:   Marland Kitchen Marital Status:   Intimate Partner Violence:   . Fear of Current or Ex-Partner:   . Emotionally Abused:   Marland Kitchen Physically Abused:   . Sexually Abused:    Family History  Problem Relation Age of Onset  . Diabetes Mother   . Diabetes Maternal Grandmother     OBJECTIVE:  Vitals:   10/11/19 0956  BP: 123/74  Pulse: 85  Resp: 18  Temp: 98.3 F (36.8 C)  TempSrc: Oral  SpO2: 97%     General appearance: Alert, NAD, appears stated age Head: NCAT Throat: lips, mucosa, and tongue normal; teeth and gums normal Lungs: CTA bilaterally without adventitious breath sounds Heart: regular rate and rhythm.   Abdomen: soft, non-tender; bowel sounds normal;  no guarding GU: deferred Skin: warm and dry Psychological:  Alert and cooperative. Normal mood and affect.   ASSESSMENT & PLAN:  1. Vaginal discharge   2. Vaginal itching   3. Vaginal irritation     Meds ordered this encounter  Medications  . fluconazole (DIFLUCAN) 200 MG tablet    Sig: Take one dose by mouth, wait 72 hours, and then take second dose by mouth    Dispense:  2 tablet    Refill:  0    Order Specific Question:   Supervising Provider    Answer:   Eustace Moore [[id number]]  . metroNIDAZOLE (FLAGYL) 500 MG tablet    Sig: Take 1 tablet (500 mg total) by mouth 2 (two) times daily.    Dispense:  14 tablet    Refill:  0    Order Specific Question:   Supervising Provider    Answer:    Eustace Moore [[id number]]    Pending: Labs Reviewed  CERVICOVAGINAL ANCILLARY ONLY    Vaginal self-swab obtained.  We will follow up with you regarding abnormal results Prescribed metronidazole 500 mg twice daily for 7 days (do not take while consuming alcohol and/or if breastfeeding) Prescribed diflucan 200 mg once daily and then second dose 72 hours later Take medications as prescribed and to completion If tests results are positive, please abstain from sexual activity until you and your partner(s) have been treated Follow up with PCP as needed Return here or go to ER if you have any new or worsening symptoms fever, chills, nausea, vomiting, abdominal or pelvic pain, painful intercourse, vaginal discharge, vaginal bleeding, persistent symptoms despite treatment, etc...  Reviewed expectations re: course of current medical issues. Questions answered. Outlined signs and symptoms indicating need for more acute intervention. Patient verbalized understanding. After Visit Summary given.       Rennis Harding, PA-C 10/11/19 1022

## 2019-10-12 LAB — CERVICOVAGINAL ANCILLARY ONLY
Bacterial Vaginitis (gardnerella): POSITIVE — AB
Candida Glabrata: NEGATIVE
Candida Vaginitis: POSITIVE — AB
Chlamydia: NEGATIVE
Comment: NEGATIVE
Comment: NEGATIVE
Comment: NEGATIVE
Comment: NEGATIVE
Comment: NEGATIVE
Comment: NORMAL
Neisseria Gonorrhea: NEGATIVE
Trichomonas: NEGATIVE

## 2019-12-06 NOTE — Progress Notes (Signed)
Patient, No Pcp Per   Chief Complaint  Patient presents with   Pelvic Pain    pelvic pain for 4-5 days    HPI:      Ms. Arnell Mausolf is a 24 y.o. [id number] whose LMP was Patient's last menstrual period was 11/17/2019., presents today for throbbing, achy pelvic pain the past 4-5 days. Did have some red BTB this wkly. Taking tylenol with some improvement. Has also had dysuria, vaginal d/c with itching for the past 4-5 days. No meds to treat. Hx of yeast vag and BV, as well as UTI yrs ago. Has had some LBP intermittently, no fevers. No GI sx, no recent abx use. Pt's wife diagnosed with non GC/chlamydia PID last month. No new sex partners. Neg STD testing 4/21 and 6/21.   Menses are monthly, lasting 7 days, no usual BTB, mild dysmen. LMP was normal.   Past Medical History:  Diagnosis Date   BV (bacterial vaginosis)    Yeast vaginitis     Past Surgical History:  Procedure Laterality Date   WISDOM TOOTH EXTRACTION      Family History  Problem Relation Age of Onset   Diabetes Mother    Diabetes Maternal Grandmother     Social History   Socioeconomic History   Marital status: Married    Spouse name: Not on file   Number of children: Not on file   Years of education: Not on file   Highest education level: Not on file  Occupational History   Not on file  Tobacco Use   Smoking status: Current Every Day Smoker    Packs/day: 0.50    Types: Cigarettes   Smokeless tobacco: Never Used  Vaping Use   Vaping Use: Never used  Substance and Sexual Activity   Alcohol use: No   Drug use: No   Sexual activity: Yes    Birth control/protection: None  Other Topics Concern   Not on file  Social History Narrative   Not on file   Social Determinants of Health   Financial Resource Strain:    Difficulty of Paying Living Expenses: Not on file  Food Insecurity:    Worried About Programme researcher, broadcasting/film/video in the Last Year: Not on file   [employer] of Food in the Last  Year: Not on file  Transportation Needs:    Lack of Transportation (Medical): Not on file   Lack of Transportation (Non-Medical): Not on file  Physical Activity:    Days of Exercise per Week: Not on file   Minutes of Exercise per Session: Not on file  Stress:    Feeling of Stress : Not on file  Social Connections:    Frequency of Communication with Friends and Family: Not on file   Frequency of Social Gatherings with Friends and Family: Not on file   Attends Religious Services: Not on file   Active Member of Clubs or Organizations: Not on file   Attends Banker Meetings: Not on file   Marital Status: Not on file  Intimate Partner Violence:    Fear of Current or Ex-Partner: Not on file   Emotionally Abused: Not on file   Physically Abused: Not on file   Sexually Abused: Not on file    Outpatient Medications Prior to Visit  Medication Sig Dispense Refill   fluconazole (DIFLUCAN) 200 MG tablet Take one dose by mouth, wait 72 hours, and then take second dose by mouth (Patient not taking: Reported on 12/07/2019)  2 tablet 0   metroNIDAZOLE (FLAGYL) 500 MG tablet Take 1 tablet (500 mg total) by mouth 2 (two) times daily. (Patient not taking: Reported on 12/07/2019) 14 tablet 0   No facility-administered medications prior to visit.      ROS:  Review of Systems  Constitutional: Negative for fever.  Gastrointestinal: Negative for blood in stool, constipation, diarrhea, nausea and vomiting.  Genitourinary: Positive for dysuria, pelvic pain and vaginal discharge. Negative for dyspareunia, flank pain, frequency, hematuria, urgency, vaginal bleeding and vaginal pain.  Musculoskeletal: Negative for back pain.  Skin: Negative for rash.   BREAST: No symptoms   OBJECTIVE:   Vitals:  BP 122/70    Ht 5\' 2"  (1.575 m)    Wt 264 lb (119.7 kg)    LMP 11/17/2019    BMI 48.29 kg/m   Physical Exam Vitals reviewed.  Constitutional:      Appearance: She is  well-developed.  Pulmonary:     Effort: Pulmonary effort is normal.  Abdominal:     Palpations: Abdomen is soft.     Tenderness: There is abdominal tenderness in the suprapubic area. There is no guarding or rebound.  Genitourinary:    Pubic Area: No rash.      Labia:        Right: No rash, tenderness or lesion.        Left: No rash, tenderness or lesion.      Vagina: Vaginal discharge present. No erythema, tenderness or bleeding.     Cervix: Normal.     Uterus: Normal. Tender. Not enlarged.      Adnexa: Right adnexa normal and left adnexa normal.       Right: No mass or tenderness.         Left: No mass or tenderness.       Comments: VAGINAL IRRITATION AT INTROITUS; NO VAG BLEEDING ON EXAM Musculoskeletal:        General: Normal range of motion.     Cervical back: Normal range of motion.  Skin:    General: Skin is warm and dry.  Neurological:     General: No focal deficit present.     Mental Status: She is alert and oriented to person, place, and time.  Psychiatric:        Mood and Affect: Mood normal.        Behavior: Behavior normal.        Thought Content: Thought content normal.        Judgment: Judgment normal.     Results: Results for orders placed or performed in visit on 12/07/19 (from the past 24 hour(s))  POCT Wet Prep with KOH     Status: Abnormal   Collection Time: 12/07/19 11:27 AM  Result Value Ref Range   Trichomonas, UA Negative    Clue Cells Wet Prep HPF POC neg    Epithelial Wet Prep HPF POC     Yeast Wet Prep HPF POC pos    Bacteria Wet Prep HPF POC     RBC Wet Prep HPF POC     WBC Wet Prep HPF POC     KOH Prep POC Negative Negative  POCT Urinalysis Dipstick     Status: Abnormal   Collection Time: 12/07/19 11:27 AM  Result Value Ref Range   Color, UA yellow    Clarity, UA clear    Glucose, UA Negative Negative   Bilirubin, UA neg    Ketones, UA neg    Spec Grav, UA 1.020 1.010 -  1.025   Blood, UA small    pH, UA 6.0 5.0 - 8.0   Protein, UA  Negative Negative   Urobilinogen, UA     Nitrite, UA neg    Leukocytes, UA Moderate (2+) (A) Negative   Appearance     Odor       Assessment/Plan: Candidal vaginitis - Plan: fluconazole (DIFLUCAN) 150 MG tablet, POCT Wet Prep with KOH; Pos sx/ wet prep. Rx diflucan. F/u prn.   Screening for STD (sexually transmitted disease) - Plan: Cervicovaginal ancillary only  Acute cystitis with hematuria - Plan: nitrofurantoin, macrocrystal-monohydrate, (MACROBID) 100 MG capsule, POCT Urinalysis Dipstick, Urine Culture; pos sx and UA. Rx macrobid. Check C&S. F/u prn.   Pelvic pain - Plan: Cervicovaginal ancillary only; rule out STD. Discussed that non-GC/CT PID not contagious. Treat for UTI. F/u if sx persist for GYN u/s.     Meds ordered this encounter  Medications   nitrofurantoin, macrocrystal-monohydrate, (MACROBID) 100 MG capsule    Sig: Take 1 capsule (100 mg total) by mouth 2 (two) times daily for 5 days.    Dispense:  10 capsule    Refill:  0    Order Specific Question:   Supervising Provider    Answer:   Nadara Mustard [[id number]]   fluconazole (DIFLUCAN) 150 MG tablet    Sig: Take 1 tablet (150 mg total) by mouth once for 1 dose. Can repeat after 3 days    Dispense:  2 tablet    Refill:  0    Order Specific Question:   Supervising Provider    Answer:   Nadara Mustard [[id number]]      Return if symptoms worsen or fail to improve.  Erionna Strum B. Yerick Eggebrecht, PA-C 12/07/2019 11:32 AM

## 2019-12-07 ENCOUNTER — Ambulatory Visit (INDEPENDENT_AMBULATORY_CARE_PROVIDER_SITE_OTHER): Payer: 59 | Admitting: Obstetrics and Gynecology

## 2019-12-07 ENCOUNTER — Encounter: Payer: Self-pay | Admitting: Obstetrics and Gynecology

## 2019-12-07 ENCOUNTER — Other Ambulatory Visit (HOSPITAL_COMMUNITY)
Admission: RE | Admit: 2019-12-07 | Discharge: 2019-12-07 | Disposition: A | Discharge status: Home / Self Care | Payer: 59 | Source: Ambulatory Visit | Attending: Obstetrics and Gynecology | Admitting: Obstetrics and Gynecology

## 2019-12-07 ENCOUNTER — Other Ambulatory Visit: Payer: Self-pay

## 2019-12-07 VITALS — BP 122/70 | Ht 62.0 in | Wt 264.0 lb

## 2019-12-07 DIAGNOSIS — Z113 Encounter for screening for infections with a predominantly sexual mode of transmission: Secondary | ICD-10-CM | POA: Insufficient documentation

## 2019-12-07 DIAGNOSIS — R102 Pelvic and perineal pain: Secondary | ICD-10-CM | POA: Insufficient documentation

## 2019-12-07 DIAGNOSIS — B373 Candidiasis of vulva and vagina: Secondary | ICD-10-CM | POA: Diagnosis not present

## 2019-12-07 DIAGNOSIS — N3001 Acute cystitis with hematuria: Secondary | ICD-10-CM | POA: Diagnosis not present

## 2019-12-07 DIAGNOSIS — B3731 Acute candidiasis of vulva and vagina: Secondary | ICD-10-CM

## 2019-12-07 LAB — POCT URINALYSIS DIPSTICK
Bilirubin, UA: NEGATIVE
Glucose, UA: NEGATIVE
Ketones, UA: NEGATIVE
Nitrite, UA: NEGATIVE
Protein, UA: NEGATIVE
Spec Grav, UA: 1.02 (ref 1.010–1.025)
pH, UA: 6 (ref 5.0–8.0)

## 2019-12-07 LAB — POCT WET PREP WITH KOH
Clue Cells Wet Prep HPF POC: NEGATIVE
KOH Prep POC: NEGATIVE
Trichomonas, UA: NEGATIVE
Yeast Wet Prep HPF POC: POSITIVE

## 2019-12-07 MED ORDER — FLUCONAZOLE 150 MG PO TABS: 150.0000 mg | ORAL_TABLET | Freq: Once | ORAL | 0 refills | Status: DC

## 2019-12-07 MED ORDER — NITROFURANTOIN MONOHYD MACRO 100 MG PO CAPS: 100.0000 mg | ORAL_CAPSULE | Freq: Two times a day (BID) | ORAL | 0 refills | Status: AC

## 2019-12-07 NOTE — Patient Instructions (Signed)
I value your feedback and entrusting us with your care. If you get a [place] patient survey, I would appreciate you taking the time to let us know about your experience today. Thank you!  As of February 26, 2019, your lab results will be released to your MyChart immediately, before I even have a chance to see them. Please give me time to review them and contact you if there are any abnormalities. Thank you for your patience.  

## 2019-12-08 LAB — CERVICOVAGINAL ANCILLARY ONLY
Chlamydia: NEGATIVE
Comment: NEGATIVE
Comment: NEGATIVE
Comment: NORMAL
Neisseria Gonorrhea: NEGATIVE
Trichomonas: NEGATIVE

## 2019-12-09 LAB — URINE CULTURE

## 2019-12-29 ENCOUNTER — Ambulatory Visit: Payer: Self-pay

## 2019-12-29 ENCOUNTER — Other Ambulatory Visit: Payer: Self-pay

## 2019-12-29 ENCOUNTER — Ambulatory Visit
Admission: EM | Admit: 2019-12-29 | Discharge: 2019-12-29 | Disposition: A | Discharge status: Home / Self Care | Payer: 59 | Attending: Emergency Medicine | Admitting: Emergency Medicine

## 2019-12-29 DIAGNOSIS — N898 Other specified noninflammatory disorders of vagina: Secondary | ICD-10-CM | POA: Diagnosis not present

## 2019-12-29 MED ORDER — FLUCONAZOLE 200 MG PO TABS: ORAL_TABLET | ORAL | 0 refills | Status: DC

## 2019-12-29 NOTE — ED Provider Notes (Signed)
[facility]   [id number] 12/29/19 Arrival Time: 1806   [id number] DISCHARGE  SUBJECTIVE:  Lori Chan is a 24 y.o. female who presents with complaints of thick cottage cheese vaginal discharge, vaginal itching, and irritation x few days.  Admits to recent sexual activity with wife.  Denies concern for STIs.  She has NOT tried OTC medications.  Denies aggravating factors.  She reports similar symptoms in the past with yeast infection.  She denies fever, chills, nausea, vomiting, abdominal or pelvic pain, urinary symptoms, vaginal odor, vaginal bleeding, dyspareunia, vaginal rashes or lesions.   Patient's last menstrual period was 12/14/2019.  ROS: As per HPI.  All other pertinent ROS negative.     Past Medical History:  Diagnosis Date   BV (bacterial vaginosis)    Yeast vaginitis    Past Surgical History:  Procedure Laterality Date   WISDOM TOOTH EXTRACTION     Allergies  Allergen Reactions   Ibuprofen Hives   Sulfa Antibiotics Hives   No current facility-administered medications on file prior to encounter.   No current outpatient medications on file prior to encounter.    Social History   Socioeconomic History   Marital status: Married    Spouse name: Not on file   Number of children: Not on file   Years of education: Not on file   Highest education level: Not on file  Occupational History   Not on file  Tobacco Use   Smoking status: Current Every Day Smoker    Packs/day: 0.50    Types: Cigarettes   Smokeless tobacco: Never Used  Vaping Use   Vaping Use: Never used  Substance and Sexual Activity   Alcohol use: No   Drug use: No   Sexual activity: Yes    Birth control/protection: None  Other Topics Concern   Not on file  Social History Narrative   Not on file   Social Determinants of Health   Financial Resource Strain:    Difficulty of Paying Living Expenses: Not on file  Food Insecurity:    Worried About Running Out  of Food in the Last Year: Not on file   [employer] of Food in the Last Year: Not on file  Transportation Needs:    Lack of Transportation (Medical): Not on file   Lack of Transportation (Non-Medical): Not on file  Physical Activity:    Days of Exercise per Week: Not on file   Minutes of Exercise per Session: Not on file  Stress:    Feeling of Stress : Not on file  Social Connections:    Frequency of Communication with Friends and Family: Not on file   Frequency of Social Gatherings with Friends and Family: Not on file   Attends Religious Services: Not on file   Active Member of Clubs or Organizations: Not on file   Attends Banker Meetings: Not on file   Marital Status: Not on file  Intimate Partner Violence:    Fear of Current or Ex-Partner: Not on file   Emotionally Abused: Not on file   Physically Abused: Not on file   Sexually Abused: Not on file   Family History  Problem Relation Age of Onset   Diabetes Mother    Diabetes Maternal Grandmother     OBJECTIVE:  Vitals:   12/29/19 1829  BP: 117/76  Pulse: 71  Resp: 20  Temp: 98.5 F (36.9 C)  SpO2: 97%     General appearance: Alert, NAD, appears stated age Head:  NCAT Throat: lips, mucosa, and tongue normal; teeth and gums normal Lungs: CTA bilaterally without adventitious breath sounds Heart: regular rate and rhythm.   Back: no CVA tenderness Abdomen: soft, non-tender; bowel sounds normal; no guarding or rebound tenderness GU: deferred Skin: warm and dry Psychological:  Alert and cooperative. Normal mood and affect.  ASSESSMENT & PLAN:  1. Vaginal discharge   2. Vaginal itching     Meds ordered this encounter  Medications   fluconazole (DIFLUCAN) 200 MG tablet    Sig: Take one dose by mouth, wait 72 hours, and then take second dose by mouth    Dispense:  2 tablet    Refill:  0    Order Specific Question:   Supervising Provider    Answer:   Eustace Moore [[id number]]     Pending: Labs Reviewed - No data to display  Prescribed diflucan 200 mg once daily and then second dose 72 hours later Take medications as prescribed and to completion Follow up with PCP Return here or go to ER if you have any new or worsening symptoms fever, chills, nausea, vomiting, abdominal or pelvic pain, painful intercourse, vaginal discharge, vaginal bleeding, persistent symptoms despite treatment, etc...  Reviewed expectations re: course of current medical issues. Questions answered. Outlined signs and symptoms indicating need for more acute intervention. Patient verbalized understanding. After Visit Summary given.       Rennis Harding, PA-C 12/29/19 1844

## 2019-12-29 NOTE — Discharge Instructions (Signed)
Prescribed diflucan 200 mg once daily and then second dose 72 hours later Take medications as prescribed and to completion Follow up with PCP Return here or go to ER if you have any new or worsening symptoms fever, chills, nausea, vomiting, abdominal or pelvic pain, painful intercourse, vaginal discharge, vaginal bleeding, persistent symptoms despite treatment, etc..Marland Kitchen

## 2019-12-29 NOTE — ED Triage Notes (Signed)
Pt presents with c/o yeast infection that began on Saturday, not concerned for std

## 2020-01-06 ENCOUNTER — Other Ambulatory Visit: Payer: Self-pay

## 2020-01-06 ENCOUNTER — Encounter: Payer: Self-pay | Admitting: Obstetrics and Gynecology

## 2020-01-06 ENCOUNTER — Ambulatory Visit (INDEPENDENT_AMBULATORY_CARE_PROVIDER_SITE_OTHER): Payer: 59 | Admitting: Obstetrics and Gynecology

## 2020-01-06 VITALS — BP 120/74 | Ht 63.0 in | Wt 261.0 lb

## 2020-01-06 DIAGNOSIS — R102 Pelvic and perineal pain: Secondary | ICD-10-CM | POA: Diagnosis not present

## 2020-01-06 DIAGNOSIS — R399 Unspecified symptoms and signs involving the genitourinary system: Secondary | ICD-10-CM | POA: Diagnosis not present

## 2020-01-06 DIAGNOSIS — N898 Other specified noninflammatory disorders of vagina: Secondary | ICD-10-CM | POA: Diagnosis not present

## 2020-01-06 NOTE — Progress Notes (Signed)
Patient, No Pcp Per   Chief Complaint  Patient presents with  . Vaginal Discharge    itchiness/irritation, no odor  . Urinary Tract Infection    burning and frequency urinating, pelvic pain    HPI:      [name] is a 24 y.o. G0P0000 whose LMP was Patient's last menstrual period was 12/14/2019 (approximate)., presents today for increased vag d/c with irritation, no fishy odor, again. Treated for yeast vag with diflucan 12/07/19 and then again at urgent care 12/29/19 with diflucan x 2. Sx resolved and then recurred after sex with wife 9/21, neg STD testing 9/21. No sex activity since 10/21 tx but sx recurred. Cleans sex products thoroughly. Uses exfoliating dove soap vag and dryer sheets. Taking probiotics. Has had ext and internal dyspareunia recently, unusual for pt.  Also with dysuria, urin frequency with and without good flow, achy pelvic pain, persisting since 9/21 appt. Had neg C&S. Was drinking sodas and tea but has cut back significantly but sx persist. No LBP  Did have episode of BRBPR twice a couple wks ago. Never had before. No n/v/d/constipation.   Past Medical History:  Diagnosis Date  . BV (bacterial vaginosis)   . Yeast vaginitis     Past Surgical History:  Procedure Laterality Date  . WISDOM TOOTH EXTRACTION      Family History  Problem Relation Age of Onset  . Diabetes Mother   . Diabetes Maternal Grandmother     Social History   Socioeconomic History  . Marital status: Married    Spouse name: Not on file  . Number of children: Not on file  . Years of education: Not on file  . Highest education level: Not on file  Occupational History  . Not on file  Tobacco Use  . Smoking status: Current Every Day Smoker    Packs/day: 0.50    Types: Cigarettes  . Smokeless tobacco: Never Used  Vaping Use  . Vaping Use: Never used  Substance and Sexual Activity  . Alcohol use: No  . Drug use: No  . Sexual activity: Yes    Birth control/protection:  None    Comment: same sex partner  Other Topics Concern  . Not on file  Social History Narrative  . Not on file   Social Determinants of Health   Financial Resource Strain:   . Difficulty of Paying Living Expenses: Not on file  Food Insecurity:   . Worried About Programme researcher, broadcasting/film/video in the Last Year: Not on file  . Ran Out of Food in the Last Year: Not on file  Transportation Needs:   . Lack of Transportation (Medical): Not on file  . Lack of Transportation (Non-Medical): Not on file  Physical Activity:   . Days of Exercise per Week: Not on file  . Minutes of Exercise per Session: Not on file  Stress:   . Feeling of Stress : Not on file  Social Connections:   . Frequency of Communication with Friends and Family: Not on file  . Frequency of Social Gatherings with Friends and Family: Not on file  . Attends Religious Services: Not on file  . Active Member of Clubs or Organizations: Not on file  . Attends Banker Meetings: Not on file  . Marital Status: Not on file  Intimate Partner Violence:   . Fear of Current or Ex-Partner: Not on file  . Emotionally Abused: Not on file  . Physically Abused: Not on file  .  Sexually Abused: Not on file    Outpatient Medications Prior to Visit  Medication Sig Dispense Refill  . fluconazole (DIFLUCAN) 200 MG tablet Take one dose by mouth, wait 72 hours, and then take second dose by mouth 2 tablet 0   No facility-administered medications prior to visit.      ROS:  Review of Systems  Constitutional: Negative for fever.  Gastrointestinal: Positive for blood in stool. Negative for constipation, diarrhea, nausea and vomiting.  Genitourinary: Positive for dyspareunia, pelvic pain and vaginal discharge. Negative for dysuria, flank pain, frequency, hematuria, urgency, vaginal bleeding and vaginal pain.  Musculoskeletal: Negative for back pain.  Skin: Negative for rash.    OBJECTIVE:   Vitals:  BP 120/74   Ht 5\' 3"  (1.6 m)    Wt 261 lb (118.4 kg)   LMP 12/14/2019 (Approximate)   BMI 46.23 kg/m   Physical Exam Vitals reviewed.  Constitutional:      Appearance: She is well-developed.  Pulmonary:     Effort: Pulmonary effort is normal.  Abdominal:     Palpations: Abdomen is soft.     Tenderness: There is abdominal tenderness in the suprapubic area. There is no guarding or rebound.  Genitourinary:    General: Normal vulva.     Pubic Area: No rash.      Labia:        Right: No rash, tenderness or lesion.        Left: No rash, tenderness or lesion.      Vagina: Vaginal discharge present. No erythema or tenderness.     Cervix: Normal.     Uterus: Normal. Not enlarged and not tender.      Adnexa: Right adnexa normal and left adnexa normal.       Right: No mass or tenderness.         Left: No mass or tenderness.    Musculoskeletal:        General: Normal range of motion.     Cervical back: Normal range of motion.  Skin:    General: Skin is warm and dry.  Neurological:     General: No focal deficit present.     Mental Status: She is alert and oriented to person, place, and time.  Psychiatric:        Mood and Affect: Mood normal.        Behavior: Behavior normal.        Thought Content: Thought content normal.        Judgment: Judgment normal.     Results: Results for orders placed or performed in visit on 01/06/20 (from the past 24 hour(s))  POCT Wet Prep with KOH     Status: Normal   Collection Time: 01/07/20  2:16 PM  Result Value Ref Range   Trichomonas, UA Negative    Clue Cells Wet Prep HPF POC neg    Epithelial Wet Prep HPF POC     Yeast Wet Prep HPF POC neg    Bacteria Wet Prep HPF POC     RBC Wet Prep HPF POC     WBC Wet Prep HPF POC     KOH Prep POC Negative Negative  POCT Urinalysis Dipstick     Status: Abnormal   Collection Time: 01/07/20  2:16 PM  Result Value Ref Range   Color, UA yellow    Clarity, UA clear    Glucose, UA Negative Negative   Bilirubin, UA neg    Ketones,  UA neg  Spec Grav, UA 1.010 1.010 - 1.025   Blood, UA neg    pH, UA 7.0 5.0 - 8.0   Protein, UA Negative Negative   Urobilinogen, UA     Nitrite, UA neg    Leukocytes, UA Trace (A) Negative   Appearance     Odor       Assessment/Plan: Pelvic pain - Plan: US PELVIS TRANSVAGINAL NON-OB (TV ONLY); tender on exam. Check GYN u/s. Will f/u with results. Rule out C&S.   Vaginal discharge - Plan: POCT Wet Prep with KOH, NuSwab Vaginitis (VG); neg wet prep. Check nuswab. D/c exfoliating soap and use dove sens skin soap, line dry underwear. Cont probiotics.   UTI symptoms - Plan: POCT Urinalysis Dipstick, Urine Culture; persisting sx with neg UA/C&S. Repeat C&S. Check GYN u/s.   Rectal bleeding--2 episodes a couple wks ago, no sx since. If Gyn eval neg, will refer to GI.    Return in about 1 day (around 01/07/2020) for GYN u/s /for pelvic pain--[facility] to call pt.  Blossie Raffel B. Rikayla Demmon, PA-C 01/07/2020 2:22 PM

## 2020-01-06 NOTE — Patient Instructions (Signed)
I value your feedback and entrusting us with your care. If you get a [facility] patient survey, I would appreciate you taking the time to let us know about your experience today. Thank you!  As of February 26, 2019, your lab results will be released to your MyChart immediately, before I even have a chance to see them. Please give me time to review them and contact you if there are any abnormalities. Thank you for your patience.  

## 2020-01-07 ENCOUNTER — Encounter: Payer: Self-pay | Admitting: Obstetrics and Gynecology

## 2020-01-07 LAB — POCT WET PREP WITH KOH
Clue Cells Wet Prep HPF POC: NEGATIVE
KOH Prep POC: NEGATIVE
Trichomonas, UA: NEGATIVE
Yeast Wet Prep HPF POC: NEGATIVE

## 2020-01-07 LAB — POCT URINALYSIS DIPSTICK
Bilirubin, UA: NEGATIVE
Blood, UA: NEGATIVE
Glucose, UA: NEGATIVE
Ketones, UA: NEGATIVE
Nitrite, UA: NEGATIVE
Protein, UA: NEGATIVE
Spec Grav, UA: 1.01 (ref 1.010–1.025)
pH, UA: 7 (ref 5.0–8.0)

## 2020-01-08 LAB — URINE CULTURE

## 2020-01-09 LAB — NUSWAB VAGINITIS (VG)
Candida albicans, NAA: NEGATIVE
Candida glabrata, NAA: NEGATIVE
Trich vag by NAA: NEGATIVE

## 2020-02-01 ENCOUNTER — Other Ambulatory Visit: Payer: Self-pay | Admitting: Obstetrics and Gynecology

## 2020-02-01 ENCOUNTER — Encounter: Payer: Self-pay | Admitting: Obstetrics and Gynecology

## 2020-02-01 MED ORDER — CLOTRIMAZOLE-BETAMETHASONE 1-0.05 % EX CREA: TOPICAL_CREAM | CUTANEOUS | 0 refills | Status: DC

## 2020-06-21 ENCOUNTER — Ambulatory Visit (INDEPENDENT_AMBULATORY_CARE_PROVIDER_SITE_OTHER): Payer: 59 | Admitting: Obstetrics and Gynecology

## 2020-06-21 ENCOUNTER — Encounter: Payer: Self-pay | Admitting: Obstetrics and Gynecology

## 2020-06-21 ENCOUNTER — Other Ambulatory Visit: Payer: Self-pay

## 2020-06-21 VITALS — BP 130/90 | Ht 63.0 in | Wt 265.0 lb

## 2020-06-21 DIAGNOSIS — N760 Acute vaginitis: Secondary | ICD-10-CM | POA: Diagnosis not present

## 2020-06-21 DIAGNOSIS — B9689 Other specified bacterial agents as the cause of diseases classified elsewhere: Secondary | ICD-10-CM

## 2020-06-21 LAB — POCT WET PREP WITH KOH
Clue Cells Wet Prep HPF POC: POSITIVE
KOH Prep POC: POSITIVE — AB
Trichomonas, UA: NEGATIVE
Yeast Wet Prep HPF POC: NEGATIVE

## 2020-06-21 MED ORDER — METRONIDAZOLE 0.75 % VA GEL: 1.0000 | Freq: Every day | VAGINAL | 0 refills | Status: AC

## 2020-06-21 NOTE — Patient Instructions (Signed)
I value your feedback and you entrusting us with your care. If you get a [facility] patient survey, I would appreciate you taking the time to let us know about your experience today. Thank you! ? ? ?

## 2020-06-21 NOTE — Progress Notes (Signed)
Patient, No Pcp Per (Inactive)   Chief Complaint  Patient presents with  . Vaginal Discharge    Had little irritation, no itchiness or odor before cycle started. Has no sx now    HPI:      Ms. Devynne Sturdivant is a 25 y.o. G0P0000 whose LMP was Patient's last menstrual period was 06/12/2020 (exact date)., presents today for vaginal d/c with itching for 2 days before LMP, but then sx resolved. No fishy odor. No meds to treat. Hx of BV and yeast in past, sometimes with pos wet prep. Was given lotrisone crm last summer with sx relief. Pt now using Dial soap vaginally. No urin sx, LBP, pelvic pain, fevers.  Has female partner.   Past Medical History:  Diagnosis Date  . BV (bacterial vaginosis)   . Yeast vaginitis     Past Surgical History:  Procedure Laterality Date  . WISDOM TOOTH EXTRACTION      Family History  Problem Relation Age of Onset  . Diabetes Mother   . Diabetes Maternal Grandmother     Social History   Socioeconomic History  . Marital status: Married    Spouse name: Not on file  . Number of children: Not on file  . Years of education: Not on file  . Highest education level: Not on file  Occupational History  . Not on file  Tobacco Use  . Smoking status: Current Every Day Smoker    Packs/day: 0.50    Types: Cigarettes  . Smokeless tobacco: Never Used  Vaping Use  . Vaping Use: Never used  Substance and Sexual Activity  . Alcohol use: No  . Drug use: No  . Sexual activity: Yes    Birth control/protection: None    Comment: same sex partner  Other Topics Concern  . Not on file  Social History Narrative  . Not on file   Social Determinants of Health   Financial Resource Strain: Not on file  Food Insecurity: Not on file  Transportation Needs: Not on file  Physical Activity: Not on file  Stress: Not on file  Social Connections: Not on file  Intimate Partner Violence: Not on file    Outpatient Medications Prior to Visit  Medication Sig  Dispense Refill  . clotrimazole-betamethasone (LOTRISONE) cream Apply externally BID prn sx up to 2 wks 15 g 0   No facility-administered medications prior to visit.      ROS:  Review of Systems  Constitutional: Negative for fever.  Gastrointestinal: Negative for blood in stool, constipation, diarrhea, nausea and vomiting.  Genitourinary: Positive for vaginal discharge. Negative for dyspareunia, dysuria, flank pain, frequency, hematuria, urgency, vaginal bleeding and vaginal pain.  Musculoskeletal: Negative for back pain.  Skin: Negative for rash.   BREAST: No symptoms   OBJECTIVE:   Vitals:  BP 130/90   Ht 5\' 3"  (1.6 m)   Wt 265 lb (120.2 kg)   LMP 06/12/2020 (Exact Date)   BMI 46.94 kg/m   Physical Exam Vitals reviewed.  Constitutional:      Appearance: She is well-developed.  Pulmonary:     Effort: Pulmonary effort is normal.  Genitourinary:    General: Normal vulva.     Pubic Area: No rash.      Labia:        Right: No rash, tenderness or lesion.        Left: No rash, tenderness or lesion.      Vagina: Vaginal discharge present. No erythema or tenderness.  Cervix: Normal.     Uterus: Normal. Not enlarged and not tender.      Adnexa: Right adnexa normal and left adnexa normal.       Right: No mass or tenderness.         Left: No mass or tenderness.    Musculoskeletal:        General: Normal range of motion.     Cervical back: Normal range of motion.  Skin:    General: Skin is warm and dry.  Neurological:     General: No focal deficit present.     Mental Status: She is alert and oriented to person, place, and time.  Psychiatric:        Mood and Affect: Mood normal.        Behavior: Behavior normal.        Thought Content: Thought content normal.        Judgment: Judgment normal.     Results: Results for orders placed or performed in visit on 06/21/20 (from the past 24 hour(s))  POCT Wet Prep with KOH     Status: Abnormal   Collection Time:  06/21/20 10:43 AM  Result Value Ref Range   Trichomonas, UA Negative    Clue Cells Wet Prep HPF POC pos    Epithelial Wet Prep HPF POC     Yeast Wet Prep HPF POC neg    Bacteria Wet Prep HPF POC     RBC Wet Prep HPF POC     WBC Wet Prep HPF POC     KOH Prep POC Positive (A) Negative     Assessment/Plan: Bacterial vaginosis - Plan: metroNIDAZOLE (METROGEL) 0.75 % vaginal gel, POCT Wet Prep with KOH; pos wet prep, sx resolved. Rx metrogel. F/u prn. Change to dove sens skin soap, Dial soap too harsh vaginally.    Meds ordered this encounter  Medications  . metroNIDAZOLE (METROGEL) 0.75 % vaginal gel    Sig: Place 1 Applicatorful vaginally at bedtime for 5 days.    Dispense:  50 g    Refill:  0    Order Specific Question:   Supervising Provider    Answer:   Nadara Mustard [[id number]]      Return if symptoms worsen or fail to improve.  Yolande Skoda B. Oaklie Durrett, PA-C 06/21/2020 10:44 AM

## 2020-09-20 ENCOUNTER — Other Ambulatory Visit: Payer: Self-pay | Admitting: Obstetrics and Gynecology

## 2020-09-20 ENCOUNTER — Other Ambulatory Visit (HOSPITAL_COMMUNITY)
Admission: RE | Admit: 2020-09-20 | Discharge: 2020-09-20 | Disposition: A | Discharge status: Home / Self Care | Payer: 59 | Source: Ambulatory Visit | Attending: Obstetrics and Gynecology | Admitting: Obstetrics and Gynecology

## 2020-09-20 ENCOUNTER — Encounter: Payer: Self-pay | Admitting: Obstetrics and Gynecology

## 2020-09-20 ENCOUNTER — Ambulatory Visit (INDEPENDENT_AMBULATORY_CARE_PROVIDER_SITE_OTHER): Payer: 59 | Admitting: Obstetrics and Gynecology

## 2020-09-20 ENCOUNTER — Other Ambulatory Visit: Payer: Self-pay

## 2020-09-20 VITALS — Ht 62.0 in | Wt 258.0 lb

## 2020-09-20 DIAGNOSIS — N760 Acute vaginitis: Secondary | ICD-10-CM

## 2020-09-20 DIAGNOSIS — Z202 Contact with and (suspected) exposure to infections with a predominantly sexual mode of transmission: Secondary | ICD-10-CM | POA: Diagnosis not present

## 2020-09-20 DIAGNOSIS — Z113 Encounter for screening for infections with a predominantly sexual mode of transmission: Secondary | ICD-10-CM | POA: Diagnosis not present

## 2020-09-20 DIAGNOSIS — R399 Unspecified symptoms and signs involving the genitourinary system: Secondary | ICD-10-CM | POA: Diagnosis not present

## 2020-09-20 DIAGNOSIS — B9689 Other specified bacterial agents as the cause of diseases classified elsewhere: Secondary | ICD-10-CM | POA: Diagnosis not present

## 2020-09-20 LAB — POCT WET PREP WITH KOH
Clue Cells Wet Prep HPF POC: POSITIVE
KOH Prep POC: POSITIVE — AB
Trichomonas, UA: NEGATIVE
Yeast Wet Prep HPF POC: NEGATIVE

## 2020-09-20 LAB — POCT URINALYSIS DIPSTICK
Bilirubin, UA: NEGATIVE
Blood, UA: NEGATIVE
Glucose, UA: NEGATIVE
Leukocytes, UA: NEGATIVE
Nitrite, UA: NEGATIVE
Protein, UA: POSITIVE — AB
Spec Grav, UA: 1.015 (ref 1.010–1.025)
Urobilinogen, UA: 1 E.U./dL
pH, UA: 7.5 (ref 5.0–8.0)

## 2020-09-20 MED ORDER — METRONIDAZOLE 500 MG PO TABS: 500.0000 mg | ORAL_TABLET | Freq: Two times a day (BID) | ORAL | 0 refills | Status: AC

## 2020-09-20 MED ORDER — FLUCONAZOLE 150 MG PO TABS: 150.0000 mg | ORAL_TABLET | Freq: Once | ORAL | 0 refills | Status: AC

## 2020-09-20 NOTE — Progress Notes (Unsigned)
PCP:  Patient, No Pcp Per (Inactive)   No chief complaint on file.  CHECK CMP, DM DUE TO UA RESULS 7/5  HPI:      [name] is a 25 y.o. No obstetric history on file. whose LMP was Patient's last menstrual period was 09/03/2020 (exact date)., presents today for her NP annual examination.  Her menses are regular every 28-30 days, lasting 7 days.  Dysmenorrhea moderate, occurring first 1-2 days of flow. She does have intermenstrual bleeding.  Sex activity: has female partner.  Last Pap: not recent; no hx of abn Hx of STDs: chlamydia, trichomonas; Neg STD testing 4/21  Has had increased d/c with irritation, no odor for past month. Hx of recurrent BV and yeast and was on abx for BV before sx started. No meds to treat this time. Not taking probiotics. Partner without sx. Toys cleaned well after use. Vag sx also causing dyspareunia. Has urinary frequency/urgency for past month, drinks caffeine. Has dysuria after sex.   There is no FH of breast cancer. There is no FH of ovarian cancer. The patient does do self-breast exams.  Tobacco use: The patient denies current or previous tobacco use. Alcohol use: none Daily marijuana use.  Exercise: not active  She does not get adequate calcium and Vitamin D in her diet. Gardasil completed.   Past Medical History:  Diagnosis Date   BV (bacterial vaginosis)    Yeast vaginitis     Past Surgical History:  Procedure Laterality Date   WISDOM TOOTH EXTRACTION      Family History  Problem Relation Age of Onset   Diabetes Mother    Diabetes Maternal Grandmother     Social History   Socioeconomic History   Marital status: Single    Spouse name: Not on file   Number of children: Not on file   Years of education: Not on file   Highest education level: Not on file  Occupational History   Not on file  Tobacco Use   Smoking status: Every Day    Packs/day: 0.50    Pack years: 0.00    Types: Cigarettes   Smokeless tobacco: Never   Vaping Use   Vaping Use: Never used  Substance and Sexual Activity   Alcohol use: No   Drug use: No   Sexual activity: Yes    Birth control/protection: None    Comment: same sex partner  Other Topics Concern   Not on file  Social History Narrative   Not on file   Social Determinants of Health   Financial Resource Strain: Not on file  Food Insecurity: Not on file  Transportation Needs: Not on file  Physical Activity: Not on file  Stress: Not on file  Social Connections: Not on file  Intimate Partner Violence: Not on file     Current Outpatient Medications:    fluconazole (DIFLUCAN) 150 MG tablet, Take 1 tablet (150 mg total) by mouth once for 1 dose., Disp: 1 tablet, Rfl: 0   metroNIDAZOLE (FLAGYL) 500 MG tablet, Take 1 tablet (500 mg total) by mouth 2 (two) times daily for 7 days., Disp: 14 tablet, Rfl: 0   ROS:  Review of Systems  Constitutional:  Negative for fatigue, fever and unexpected weight change.  Respiratory:  Negative for cough, shortness of breath and wheezing.   Cardiovascular:  Negative for chest pain, palpitations and leg swelling.  Gastrointestinal:  Negative for blood in stool, constipation, diarrhea, nausea and vomiting.  Endocrine: Negative for cold  intolerance, heat intolerance and polyuria.  Genitourinary:  Positive for dyspareunia, dysuria, frequency, urgency and vaginal discharge. Negative for flank pain, genital sores, hematuria, menstrual problem, pelvic pain, vaginal bleeding and vaginal pain.  Musculoskeletal:  Negative for back pain, joint swelling and myalgias.  Skin:  Negative for rash.  Neurological:  Negative for dizziness, syncope, light-headedness, numbness and headaches.  Hematological:  Negative for adenopathy.  Psychiatric/Behavioral:  Negative for agitation, confusion, sleep disturbance and suicidal ideas. The patient is not nervous/anxious.  BREAST: No symptoms   Objective: LMP 09/03/2020 (Exact Date)    Physical  Exam Constitutional:      Appearance: She is well-developed.  Genitourinary:     Vulva normal.     Vaginal bleeding present.     No vaginal discharge, erythema or tenderness.      Right Adnexa: not tender and no mass present.    Left Adnexa: not tender and no mass present.    No cervical polyp.     Uterus is not enlarged or tender.  Breasts:    Right: No mass, nipple discharge, skin change or tenderness.     Left: No mass, nipple discharge, skin change or tenderness.  Neck:     Thyroid: No thyromegaly.  Cardiovascular:     Rate and Rhythm: Normal rate and regular rhythm.     Heart sounds: Normal heart sounds. No murmur heard. Pulmonary:     Effort: Pulmonary effort is normal.     Breath sounds: Normal breath sounds.  Abdominal:     Palpations: Abdomen is soft.     Tenderness: There is no abdominal tenderness. There is no guarding.  Musculoskeletal:        General: Normal range of motion.     Cervical back: Normal range of motion.  Neurological:     General: No focal deficit present.     Mental Status: She is alert and oriented to person, place, and time.     Cranial Nerves: No cranial nerve deficit.  Skin:    General: Skin is warm and dry.  Psychiatric:        Mood and Affect: Mood normal.        Behavior: Behavior normal.        Thought Content: Thought content normal.        Judgment: Judgment normal.  Vitals reviewed.    Results: Results for orders placed or performed in visit on 09/20/20 (from the past 24 hour(s))  POCT Urinalysis Dipstick     Status: Abnormal   Collection Time: 09/20/20 11:48 AM  Result Value Ref Range   Color, UA amber    Clarity, UA clear    Glucose, UA Negative Negative   Bilirubin, UA neg    Ketones, UA mod    Spec Grav, UA 1.015 1.010 - 1.025   Blood, UA neg    pH, UA 7.5 5.0 - 8.0   Protein, UA Positive (A) Negative   Urobilinogen, UA 1.0 0.2 or 1.0 E.U./dL   Nitrite, UA neg    Leukocytes, UA Negative Negative   Appearance      Odor    POCT Wet Prep with KOH     Status: Abnormal   Collection Time: 09/20/20 11:49 AM  Result Value Ref Range   Trichomonas, UA Negative    Clue Cells Wet Prep HPF POC pos    Epithelial Wet Prep HPF POC     Yeast Wet Prep HPF POC neg    Bacteria Wet Prep HPF POC  RBC Wet Prep HPF POC     WBC Wet Prep HPF POC     KOH Prep POC Positive (A) Negative  PT WITH MENSTRUAL BLEEDING  Assessment/Plan: Encounter for annual routine gynecological examination  Cervical cancer screening - Plan: Cytology - PAP  Screening for STD (sexually transmitted disease) - Plan: Cytology - PAP  Yeast vaginitis - Plan: POCT Wet Prep with KOH, fluconazole (DIFLUCAN) 150 MG tablet; Pos sx/wet prep. Rx diflucan. Add probiotics. Partner needs BV tx if has sx to prevent spreading.   Urinary frequency - Plan: POCT Urinalysis Dipstick; Neg UA. D/c caffeine to see if sx improve. F/u prn  No orders of the defined types were placed in this encounter.            GYN counsel adequate intake of calcium and vitamin D, diet and exercise     F/U  No follow-ups on file.  Braylinn Gulden B. Albie Bazin, PA-C 09/20/2020 11:53 AM

## 2020-09-20 NOTE — Addendum Note (Signed)
Addended by: Althea Grimmer B on: 09/20/2020 02:50 PM   Modules accepted: Orders

## 2020-09-20 NOTE — Progress Notes (Signed)
Patient, No Pcp Per (Inactive)   Chief Complaint  Patient presents with   STD testing   Urinary Tract Infection    Frequency and burning urinating, lower back pain x 2 weeks    HPI:      [name] is a 25 y.o. G0P0000 whose LMP was Patient's last menstrual period was 09/03/2020 (exact date)., presents today for increased vag d/c with odor, occas itch and dysuria. No meds to treat, no recent abx use. Hx of BV and yeast vag; BV last treated 4/22 with metrogel. Pt also with urinary frequency with decreased flow, dysuria, LBP and mild pelvic discomfort for the past 2 wks. No fevers. Cutting down on caffeine.  Pt's partner recently unfaithful so pt would like full STD testing. No hx of HSV.  Neg STD testing 9/21  Past Medical History:  Diagnosis Date   BV (bacterial vaginosis)    Yeast vaginitis     Past Surgical History:  Procedure Laterality Date   WISDOM TOOTH EXTRACTION      Family History  Problem Relation Age of Onset   Diabetes Mother    Diabetes Maternal Grandmother     Social History   Socioeconomic History   Marital status: Single    Spouse name: Not on file   Number of children: Not on file   Years of education: Not on file   Highest education level: Not on file  Occupational History   Not on file  Tobacco Use   Smoking status: Every Day    Packs/day: 0.50    Pack years: 0.00    Types: Cigarettes   Smokeless tobacco: Never  Vaping Use   Vaping Use: Never used  Substance and Sexual Activity   Alcohol use: No   Drug use: No   Sexual activity: Yes    Birth control/protection: None    Comment: same sex partner  Other Topics Concern   Not on file  Social History Narrative   Not on file   Social Determinants of Health   Financial Resource Strain: Not on file  Food Insecurity: Not on file  Transportation Needs: Not on file  Physical Activity: Not on file  Stress: Not on file  Social Connections: Not on file  Intimate Partner Violence:  Not on file    No outpatient medications prior to visit.   No facility-administered medications prior to visit.      ROS:  Review of Systems  Constitutional:  Negative for fever.  Gastrointestinal:  Negative for blood in stool, constipation, diarrhea, nausea and vomiting.  Genitourinary:  Positive for dysuria, frequency, pelvic pain and vaginal discharge. Negative for dyspareunia, flank pain, hematuria, urgency, vaginal bleeding and vaginal pain.  Musculoskeletal:  Positive for back pain.  Skin:  Negative for rash.  BREAST: No symptoms   OBJECTIVE:   Vitals:  BP 110/70   Ht 5\' 2"  (1.575 m)   Wt 258 lb (117 kg)   LMP 09/03/2020 (Exact Date)   BMI 47.19 kg/m   Physical Exam Vitals reviewed.  Constitutional:      Appearance: She is well-developed. She is not ill-appearing or toxic-appearing.  Pulmonary:     Effort: Pulmonary effort is normal.  Abdominal:     Palpations: Abdomen is soft.     Tenderness: There is abdominal tenderness in the suprapubic area. There is no right CVA tenderness or left CVA tenderness.  Genitourinary:    General: Normal vulva.     Pubic Area: No rash.  Labia:        Right: No rash, tenderness or lesion.        Left: No rash, tenderness or lesion.      Vagina: Vaginal discharge present. No erythema or tenderness.     Cervix: Normal.     Uterus: Normal. Tender. Not enlarged.      Adnexa: Right adnexa normal and left adnexa normal.       Right: No mass or tenderness.         Left: No mass or tenderness.    Musculoskeletal:        General: Normal range of motion.     Cervical back: Normal range of motion.  Skin:    General: Skin is warm and dry.  Neurological:     General: No focal deficit present.     Mental Status: She is alert and oriented to person, place, and time.     Cranial Nerves: No cranial nerve deficit.  Psychiatric:        Mood and Affect: Mood normal.        Behavior: Behavior normal.        Thought Content: Thought  content normal.        Judgment: Judgment normal.    Results: Results for orders placed or performed in visit on 09/20/20 (from the past 24 hour(s))  POCT Urinalysis Dipstick     Status: Abnormal   Collection Time: 09/20/20 11:48 AM  Result Value Ref Range   Color, UA amber    Clarity, UA clear    Glucose, UA Negative Negative   Bilirubin, UA neg    Ketones, UA mod    Spec Grav, UA 1.015 1.010 - 1.025   Blood, UA neg    pH, UA 7.5 5.0 - 8.0   Protein, UA Positive (A) Negative   Urobilinogen, UA 1.0 0.2 or 1.0 E.U./dL   Nitrite, UA neg    Leukocytes, UA Negative Negative   Appearance     Odor    POCT Wet Prep with KOH     Status: Abnormal   Collection Time: 09/20/20 11:49 AM  Result Value Ref Range   Trichomonas, UA Negative    Clue Cells Wet Prep HPF POC pos    Epithelial Wet Prep HPF POC     Yeast Wet Prep HPF POC neg    Bacteria Wet Prep HPF POC     RBC Wet Prep HPF POC     WBC Wet Prep HPF POC     KOH Prep POC Positive (A) Negative   No hx of DM.  Assessment/Plan: BV (bacterial vaginosis) - Plan: POCT Wet Prep with KOH, metroNIDAZOLE (FLAGYL) 500 MG tablet, fluconazole (DIFLUCAN) 150 MG tablet; pos sx and wet prep. Rx flagyl, no EtOH. Rx diflucan prn yeast vag after abx tx. F/u prn.   UTI symptoms - Plan: POCT Urinalysis Dipstick; neg UA. Check C&S. Will f/u if pos.   Exposure to STD - Plan: HIV Antibody (routine testing w rflx), RPR, HSV 2 antibody, IgG, Hepatitis C antibody, Cervicovaginal ancillary only  Screening for STD (sexually transmitted disease) - Plan: HIV Antibody (routine testing w rflx), RPR, HSV 2 antibody, IgG, Hepatitis C antibody, Cervicovaginal ancillary only    Meds ordered this encounter  Medications   metroNIDAZOLE (FLAGYL) 500 MG tablet    Sig: Take 1 tablet (500 mg total) by mouth 2 (two) times daily for 7 days.    Dispense:  14 tablet    Refill:  0  Order Specific Question:   Supervising Provider    Answer:   Nadara Mustard  [[id number]]   fluconazole (DIFLUCAN) 150 MG tablet    Sig: Take 1 tablet (150 mg total) by mouth once for 1 dose.    Dispense:  1 tablet    Refill:  0    Order Specific Question:   Supervising Provider    Answer:   Nadara Mustard [[id number]]       Return if symptoms worsen or fail to improve.  [name], PA-C 09/20/2020 11:54 AM

## 2020-09-21 LAB — HEPATITIS C ANTIBODY: Hep C Virus Ab: <0.1 s/co ratio (ref 0.0–0.9)

## 2020-09-21 LAB — HIV ANTIBODY (ROUTINE TESTING W REFLEX): HIV Screen 4th Generation wRfx: NONREACTIVE

## 2020-09-21 LAB — RPR: RPR Ser Ql: NONREACTIVE

## 2020-09-21 LAB — HSV 2 ANTIBODY, IGG: HSV 2 IgG, Type Spec: <0.91 index (ref 0.00–0.90)

## 2020-09-22 LAB — URINE CULTURE: Organism ID, Bacteria: NO GROWTH

## 2020-09-22 LAB — CERVICOVAGINAL ANCILLARY ONLY
Chlamydia: NEGATIVE
Comment: NEGATIVE
Comment: NEGATIVE
Comment: NORMAL
Neisseria Gonorrhea: NEGATIVE
Trichomonas: NEGATIVE

## 2020-10-13 ENCOUNTER — Ambulatory Visit (INDEPENDENT_AMBULATORY_CARE_PROVIDER_SITE_OTHER): Payer: 59 | Admitting: Obstetrics and Gynecology

## 2020-10-13 ENCOUNTER — Encounter: Payer: Self-pay | Admitting: Obstetrics and Gynecology

## 2020-10-13 ENCOUNTER — Other Ambulatory Visit (HOSPITAL_COMMUNITY)
Admission: RE | Admit: 2020-10-13 | Discharge: 2020-10-13 | Disposition: A | Discharge status: Home / Self Care | Payer: 59 | Source: Ambulatory Visit | Attending: Obstetrics and Gynecology | Admitting: Obstetrics and Gynecology

## 2020-10-13 ENCOUNTER — Other Ambulatory Visit: Payer: Self-pay

## 2020-10-13 VITALS — BP 100/70 | Ht 63.0 in | Wt 254.0 lb

## 2020-10-13 DIAGNOSIS — N760 Acute vaginitis: Secondary | ICD-10-CM | POA: Diagnosis not present

## 2020-10-13 DIAGNOSIS — Z113 Encounter for screening for infections with a predominantly sexual mode of transmission: Secondary | ICD-10-CM | POA: Insufficient documentation

## 2020-10-13 DIAGNOSIS — B9689 Other specified bacterial agents as the cause of diseases classified elsewhere: Secondary | ICD-10-CM | POA: Diagnosis not present

## 2020-10-13 DIAGNOSIS — Z01419 Encounter for gynecological examination (general) (routine) without abnormal findings: Secondary | ICD-10-CM

## 2020-10-13 LAB — POCT WET PREP WITH KOH
KOH Prep POC: POSITIVE — AB
Trichomonas, UA: NEGATIVE
Yeast Wet Prep HPF POC: NEGATIVE

## 2020-10-13 MED ORDER — CLINDAMYCIN HCL 300 MG PO CAPS: 300.0000 mg | ORAL_CAPSULE | Freq: Two times a day (BID) | ORAL | 0 refills | Status: AC

## 2020-10-13 NOTE — Patient Instructions (Signed)
I value your feedback and you entrusting us with your care. If you get a [facility] patient survey, I would appreciate you taking the time to let us know about your experience today. Thank you! ? ? ?

## 2020-10-13 NOTE — Progress Notes (Signed)
PCP:  Patient, No Pcp Per (Inactive)   Chief Complaint  Patient presents with   Gynecologic Exam    No concerns   Vaginal Irritation    After intercourse     HPI:      [name] is a 25 y.o. No obstetric history on file. whose LMP was Patient's last menstrual period was 10/01/2020 (exact date)., presents today for her annual examination.  Her menses are regular every 28-30 days, lasting 5-7 days.  Dysmenorrhea mild, occurring first 1-2 days of flow. She does have intermenstrual bleeding.  Sex activity: currently with female partner since seen 09/20/20; used to have female partner. Contraception--none; declines for now. Had pain with sex and then irritation for several hrs afterwards. No vag dryness, no lubricant used. No bleeding.  Last Pap: 08/26/19 Results were normal;  no hx of abn Hx of STDs: chlamydia, trichomonas; Neg STD testing 7/22 but has new partner.   Hx of recurrent BV and yeast vag; BV last treated 7/22 with flagyl with sx relief. Developed increased vag d/c after recent sex activity with new partner, no odor, had irritation for a short time. Sx for 1 1/2 wks, no meds to treat. No condoms. Uses dove sens skin soap, no dryer sheets, no wipes, rare thong use, uses cotton underwear.   There is no FH of breast cancer. There is no FH of ovarian cancer. The patient does self-breast exams.  Tobacco use: The patient denies current or previous tobacco use. Alcohol use: none Daily marijuana use daily.  Exercise: not active  She does get adequate calcium but not Vitamin D in her diet. Gardasil completed.   Past Medical History:  Diagnosis Date   BV (bacterial vaginosis)    Yeast vaginitis     Past Surgical History:  Procedure Laterality Date   WISDOM TOOTH EXTRACTION      Family History  Problem Relation Age of Onset   Diabetes Mother    Diabetes Maternal Grandmother     Social History   Socioeconomic History   Marital status: Single    Spouse name: Not on  file   Number of children: Not on file   Years of education: Not on file   Highest education level: Not on file  Occupational History   Not on file  Tobacco Use   Smoking status: Every Day    Packs/day: 0.50    Types: Cigarettes   Smokeless tobacco: Never  Vaping Use   Vaping Use: Never used  Substance and Sexual Activity   Alcohol use: No   Drug use: No   Sexual activity: Yes    Birth control/protection: None    Comment: same sex partner  Other Topics Concern   Not on file  Social History Narrative   Not on file   Social Determinants of Health   Financial Resource Strain: Not on file  Food Insecurity: Not on file  Transportation Needs: Not on file  Physical Activity: Not on file  Stress: Not on file  Social Connections: Not on file  Intimate Partner Violence: Not on file     Current Outpatient Medications:    clindamycin (CLEOCIN) 300 MG capsule, Take 1 capsule (300 mg total) by mouth 2 (two) times daily for 7 days., Disp: 14 capsule, Rfl: 0   ROS:  Review of Systems  Constitutional:  Negative for fatigue, fever and unexpected weight change.  Respiratory:  Negative for cough, shortness of breath and wheezing.   Cardiovascular:  Negative for  chest pain, palpitations and leg swelling.  Gastrointestinal:  Negative for blood in stool, constipation, diarrhea, nausea and vomiting.  Endocrine: Negative for cold intolerance, heat intolerance and polyuria.  Genitourinary:  Positive for dyspareunia and vaginal discharge. Negative for dysuria, flank pain, frequency, genital sores, hematuria, menstrual problem, pelvic pain, urgency, vaginal bleeding and vaginal pain.  Musculoskeletal:  Negative for back pain, joint swelling and myalgias.  Skin:  Negative for rash.  Neurological:  Negative for dizziness, syncope, light-headedness, numbness and headaches.  Hematological:  Negative for adenopathy.  Psychiatric/Behavioral:  Negative for agitation, confusion, sleep disturbance  and suicidal ideas. The patient is not nervous/anxious.  BREAST: No symptoms   Objective: BP 100/70   Ht 5\' 3"  (1.6 m)   Wt 254 lb (115.2 kg)   LMP 10/01/2020 (Exact Date)   BMI 44.99 kg/m    Physical Exam Constitutional:      Appearance: She is well-developed.  Genitourinary:     Vulva normal.     Right Labia: No rash, tenderness or lesions.    Left Labia: No tenderness, lesions or rash.    Vaginal bleeding present.     No vaginal discharge, erythema or tenderness.      Right Adnexa: not tender and no mass present.    Left Adnexa: not tender and no mass present.    No cervical friability or polyp.     Uterus is not enlarged or tender.  Breasts:    Right: No mass, nipple discharge, skin change or tenderness.     Left: No mass, nipple discharge, skin change or tenderness.  Neck:     Thyroid: No thyromegaly.  Cardiovascular:     Rate and Rhythm: Normal rate and regular rhythm.     Heart sounds: Normal heart sounds. No murmur heard. Pulmonary:     Effort: Pulmonary effort is normal.     Breath sounds: Normal breath sounds.  Abdominal:     Palpations: Abdomen is soft.     Tenderness: There is no abdominal tenderness. There is no guarding or rebound.  Musculoskeletal:        General: Normal range of motion.     Cervical back: Normal range of motion.  Lymphadenopathy:     Cervical: No cervical adenopathy.  Neurological:     General: No focal deficit present.     Mental Status: She is alert and oriented to person, place, and time.     Cranial Nerves: No cranial nerve deficit.  Skin:    General: Skin is warm and dry.  Psychiatric:        Mood and Affect: Mood normal.        Behavior: Behavior normal.        Thought Content: Thought content normal.        Judgment: Judgment normal.  Vitals reviewed.    Results: Results for orders placed or performed in visit on 10/13/20 (from the past 24 hour(s))  POCT Wet Prep with KOH     Status: Abnormal   Collection Time:  10/13/20  5:48 PM  Result Value Ref Range   Trichomonas, UA Negative    Clue Cells Wet Prep HPF POC few    Epithelial Wet Prep HPF POC     Yeast Wet Prep HPF POC neg    Bacteria Wet Prep HPF POC     RBC Wet Prep HPF POC     WBC Wet Prep HPF POC     KOH Prep POC Positive (A) Negative  Assessment/Plan: Encounter for annual routine gynecological examination  Screening for STD (sexually transmitted disease) - Plan: Cervicovaginal ancillary only  BV (bacterial vaginosis) - Plan: clindamycin (CLEOCIN) 300 MG capsule, POCT Wet Prep with KOH; pos sx and wet prep. Treat with Rx clindamycin, start boric acid supp QHS for 2-3 months. Condoms. Discussed causes and prevention. F/u prn.   Declines BC, will call if desires.   Meds ordered this encounter  Medications   clindamycin (CLEOCIN) 300 MG capsule    Sig: Take 1 capsule (300 mg total) by mouth 2 (two) times daily for 7 days.    Dispense:  14 capsule    Refill:  0    Order Specific Question:   Supervising Provider    Answer:   Nadara Mustard [[id number]]              GYN counsel adequate intake of calcium and vitamin D, diet and exercise     F/U  Return in about 1 year (around 10/13/2021).  Adline Kirshenbaum B. Cherrell Maybee, PA-C 10/13/2020 5:48 PM

## 2020-10-17 LAB — CERVICOVAGINAL ANCILLARY ONLY
Chlamydia: NEGATIVE
Comment: NEGATIVE
Comment: NORMAL
Neisseria Gonorrhea: NEGATIVE

## 2020-10-21 ENCOUNTER — Encounter: Payer: Self-pay | Admitting: Obstetrics and Gynecology

## 2020-10-24 ENCOUNTER — Other Ambulatory Visit: Payer: Self-pay | Admitting: Obstetrics and Gynecology

## 2020-10-24 DIAGNOSIS — B3731 Acute candidiasis of vulva and vagina: Secondary | ICD-10-CM

## 2020-10-24 DIAGNOSIS — B373 Candidiasis of vulva and vagina: Secondary | ICD-10-CM

## 2020-10-24 MED ORDER — FLUCONAZOLE 150 MG PO TABS: 150.0000 mg | ORAL_TABLET | Freq: Once | ORAL | 0 refills | Status: AC

## 2020-10-24 NOTE — Telephone Encounter (Signed)
Rx diflucan eRxd. Will take a day or so to work. F/u prn

## 2020-10-24 NOTE — Progress Notes (Signed)
Rx diflucan for yeast vag sx after BV tx  

## 2020-10-24 NOTE — Telephone Encounter (Signed)
Patient states Monistat isn't working. It's very irritating. She would like to get this under control. [phone]

## 2020-11-08 ENCOUNTER — Ambulatory Visit (INDEPENDENT_AMBULATORY_CARE_PROVIDER_SITE_OTHER): Payer: 59 | Admitting: Obstetrics and Gynecology

## 2020-11-08 ENCOUNTER — Encounter: Payer: Self-pay | Admitting: Obstetrics and Gynecology

## 2020-11-08 ENCOUNTER — Other Ambulatory Visit: Payer: Self-pay

## 2020-11-08 VITALS — BP 110/60 | Ht 63.0 in | Wt 255.0 lb

## 2020-11-08 DIAGNOSIS — A599 Trichomoniasis, unspecified: Secondary | ICD-10-CM

## 2020-11-08 DIAGNOSIS — N898 Other specified noninflammatory disorders of vagina: Secondary | ICD-10-CM

## 2020-11-08 LAB — POCT WET PREP WITH KOH
Clue Cells Wet Prep HPF POC: NEGATIVE
KOH Prep POC: NEGATIVE
Trichomonas, UA: NEGATIVE
Yeast Wet Prep HPF POC: NEGATIVE

## 2020-11-08 MED ORDER — CLOTRIMAZOLE-BETAMETHASONE 1-0.05 % EX CREA: TOPICAL_CREAM | CUTANEOUS | 0 refills | Status: DC

## 2020-11-08 NOTE — Progress Notes (Signed)
Patient, No Pcp Per (Inactive)   Chief Complaint  Patient presents with   Vaginal Discharge    Itchiness and a lot of irritation, no odor x 1 week    HPI:      Ms. Cintia Gleed is a 25 y.o. G0P0000 whose LMP was Patient's last menstrual period was 10/29/2020 (approximate)., presents today for persistent vaginal itching/irritation, dysuria, increased white d/c, no odor since early 8/22. No other urin sx, no LBP, pelvic pain, fevers. Treated for BV with clindamycin end of July and then developed yeast vag sx. Did monistat-7 and 2 diflucan without sx relief. Taking probiotics. Is sex active, using condoms, no new partners, neg STD testing 7/22. Sex too painful recently.  Hx of recurrent BV and yeast vag. Uses dove sens skin soap, no dryer sheets, no wipes,no thong use, uses cotton underwear.  Past Medical History:  Diagnosis Date   BV (bacterial vaginosis)    Yeast vaginitis     Past Surgical History:  Procedure Laterality Date   WISDOM TOOTH EXTRACTION      Family History  Problem Relation Age of Onset   Diabetes Mother    Diabetes Maternal Grandmother     Social History   Socioeconomic History   Marital status: Single    Spouse name: Not on file   Number of children: Not on file   Years of education: Not on file   Highest education level: Not on file  Occupational History   Not on file  Tobacco Use   Smoking status: Every Day    Packs/day: 0.50    Types: Cigarettes   Smokeless tobacco: Never  Vaping Use   Vaping Use: Never used  Substance and Sexual Activity   Alcohol use: No   Drug use: No   Sexual activity: Yes    Birth control/protection: None    Comment: same sex partner  Other Topics Concern   Not on file  Social History Narrative   Not on file   Social Determinants of Health   Financial Resource Strain: Not on file  Food Insecurity: Not on file  Transportation Needs: Not on file  Physical Activity: Not on file  Stress: Not on file  Social  Connections: Not on file  Intimate Partner Violence: Not on file    Outpatient Medications Prior to Visit  Medication Sig Dispense Refill   Probiotic Product (PROBIOTIC PO) Take by mouth.     No facility-administered medications prior to visit.      ROS:  Review of Systems  Constitutional:  Negative for fever.  Gastrointestinal:  Negative for blood in stool, constipation, diarrhea, nausea and vomiting.  Genitourinary:  Positive for dysuria and vaginal discharge. Negative for dyspareunia, flank pain, frequency, hematuria, urgency, vaginal bleeding and vaginal pain.  Musculoskeletal:  Negative for back pain.  Skin:  Negative for rash.  BREAST: No symptoms   OBJECTIVE:   Vitals:  BP 110/60   Ht 5\' 3"  (1.6 m)   Wt 255 lb (115.7 kg)   LMP 10/29/2020 (Approximate)   BMI 45.17 kg/m   Physical Exam Vitals reviewed.  Constitutional:      Appearance: She is well-developed.  Pulmonary:     Effort: Pulmonary effort is normal.  Genitourinary:    Pubic Area: No rash.      Labia:        Right: Tenderness present. No rash or lesion.        Left: Tenderness present. No rash or lesion.  Vagina: Vaginal discharge present. No erythema or tenderness.     Cervix: Normal.     Uterus: Normal. Not enlarged and not tender.      Adnexa: Right adnexa normal and left adnexa normal.       Right: No mass or tenderness.         Left: No mass or tenderness.       Comments: BILAT LABIA MINORA WITH ERYTHEMA/IRRITATION Musculoskeletal:        General: Normal range of motion.     Cervical back: Normal range of motion.  Skin:    General: Skin is warm and dry.  Neurological:     General: No focal deficit present.     Mental Status: She is alert and oriented to person, place, and time.  Psychiatric:        Mood and Affect: Mood normal.        Behavior: Behavior normal.        Thought Content: Thought content normal.        Judgment: Judgment normal.    Results: Results for orders  placed or performed in visit on 11/08/20 (from the past 24 hour(s))  POCT Wet Prep with KOH     Status: Normal   Collection Time: 11/08/20 11:00 AM  Result Value Ref Range   Trichomonas, UA Negative    Clue Cells Wet Prep HPF POC neg    Epithelial Wet Prep HPF POC     Yeast Wet Prep HPF POC neg    Bacteria Wet Prep HPF POC     RBC Wet Prep HPF POC     WBC Wet Prep HPF POC     KOH Prep POC Negative Negative     Assessment/Plan: Vaginal itching - Plan: clotrimazole-betamethasone (LOTRISONE) cream, POCT Wet Prep with KOH, NuSwab Vaginitis (VG); pos sx and exam, neg wet prep. Question fungal, not yeast. Check yeast culture. Rx lotrisone crm. Will f/u with results.   Vaginal discharge - Plan: NuSwab Vaginitis (VG)   Meds ordered this encounter  Medications   clotrimazole-betamethasone (LOTRISONE) cream    Sig: Apply externally BID prn sx up to 2 wks    Dispense:  15 g    Refill:  0    Order Specific Question:   Supervising Provider    Answer:   Nadara Mustard [[id number]]       Return if symptoms worsen or fail to improve.  Veleta Yamamoto B. Makalya Nave, PA-C 11/08/2020 11:01 AM

## 2020-11-11 ENCOUNTER — Encounter: Payer: Self-pay | Admitting: Obstetrics and Gynecology

## 2020-11-13 DIAGNOSIS — A599 Trichomoniasis, unspecified: Secondary | ICD-10-CM | POA: Insufficient documentation

## 2020-11-13 LAB — NUSWAB VAGINITIS (VG)
Candida albicans, NAA: NEGATIVE
Candida glabrata, NAA: NEGATIVE
Trich vag by NAA: POSITIVE — AB

## 2020-11-13 MED ORDER — METRONIDAZOLE 500 MG PO TABS: ORAL_TABLET | ORAL | 0 refills | Status: DC

## 2020-11-13 NOTE — Addendum Note (Signed)
Addended by: Althea Grimmer B on: 11/13/2020 09:01 PM   Modules accepted: Orders

## 2020-12-07 NOTE — Progress Notes (Signed)
Patient, No Pcp Per (Inactive)   Chief Complaint  Patient presents with   Follow-up    HPI:      Lori Chan is a 25 y.o. G0P0000 whose LMP was Patient's last menstrual period was 11/24/2020 (exact date)., presents today for trich TOC from 8/22, treated with flagyl. Thinks partner did tx but not sex active currently. No vag sx. Hx of recurrent yeast/BV but not sx currently. Has occas dysuria at end of stream but no other urin sx. Drinking lots of water.    Past Medical History:  Diagnosis Date   BV (bacterial vaginosis)    Yeast vaginitis     Past Surgical History:  Procedure Laterality Date   WISDOM TOOTH EXTRACTION      Family History  Problem Relation Age of Onset   Diabetes Mother    Diabetes Maternal Grandmother     Social History   Socioeconomic History   Marital status: Single    Spouse name: Not on file   Number of children: Not on file   Years of education: Not on file   Highest education level: Not on file  Occupational History   Not on file  Tobacco Use   Smoking status: Every Day    Packs/day: 0.50    Types: Cigarettes   Smokeless tobacco: Never  Vaping Use   Vaping Use: Never used  Substance and Sexual Activity   Alcohol use: No   Drug use: No   Sexual activity: Yes    Birth control/protection: None    Comment: same sex partner  Other Topics Concern   Not on file  Social History Narrative   Not on file   Social Determinants of Health   Financial Resource Strain: Not on file  Food Insecurity: Not on file  Transportation Needs: Not on file  Physical Activity: Not on file  Stress: Not on file  Social Connections: Not on file  Intimate Partner Violence: Not on file    Outpatient Medications Prior to Visit  Medication Sig Dispense Refill   Probiotic Product (PROBIOTIC PO) Take by mouth.     clotrimazole-betamethasone (LOTRISONE) cream Apply externally BID prn sx up to 2 wks 15 g 0   metroNIDAZOLE (FLAGYL) 500 MG tablet Take 2  tabs BID for 1 day 4 tablet 0   No facility-administered medications prior to visit.      ROS:  Review of Systems  Constitutional:  Negative for fever.  Gastrointestinal:  Negative for blood in stool, constipation, diarrhea, nausea and vomiting.  Genitourinary:  Negative for dyspareunia, dysuria, flank pain, frequency, hematuria, urgency, vaginal bleeding, vaginal discharge and vaginal pain.  Musculoskeletal:  Negative for back pain.  Skin:  Negative for rash.  BREAST: No symptoms   OBJECTIVE:   Vitals:  BP 120/80   Ht 5\' 3"  (1.6 m)   Wt 253 lb (114.8 kg)   LMP 11/24/2020 (Exact Date)   BMI 44.82 kg/m   Physical Exam Vitals reviewed.  Constitutional:      Appearance: She is well-developed.  Pulmonary:     Effort: Pulmonary effort is normal.  Genitourinary:    General: Normal vulva.     Pubic Area: No rash.      Labia:        Right: No rash, tenderness or lesion.        Left: No rash, tenderness or lesion.      Vagina: Normal. No vaginal discharge, erythema or tenderness.     Cervix: Normal.  Uterus: Normal. Not enlarged and not tender.      Adnexa: Right adnexa normal and left adnexa normal.       Right: No mass or tenderness.         Left: No mass or tenderness.    Musculoskeletal:        General: Normal range of motion.     Cervical back: Normal range of motion.  Skin:    General: Skin is warm and dry.  Neurological:     General: No focal deficit present.     Mental Status: She is alert and oriented to person, place, and time.  Psychiatric:        Mood and Affect: Mood normal.        Behavior: Behavior normal.        Thought Content: Thought content normal.        Judgment: Judgment normal.    Assessment/Plan: Trichomoniasis - Plan: Cervicovaginal ancillary only; TOC today. Will f/u with results if pos.   Screening for STD (sexually transmitted disease) - Plan: Cervicovaginal ancillary only    Return if symptoms worsen or fail to  improve.  Journe Hallmark B. Corday Wyka, PA-C 12/08/2020 9:13 AM

## 2020-12-08 ENCOUNTER — Other Ambulatory Visit (HOSPITAL_COMMUNITY)
Admission: RE | Admit: 2020-12-08 | Discharge: 2020-12-08 | Disposition: A | Discharge status: Home / Self Care | Payer: 59 | Source: Ambulatory Visit | Attending: Obstetrics and Gynecology | Admitting: Obstetrics and Gynecology

## 2020-12-08 ENCOUNTER — Encounter: Payer: Self-pay | Admitting: Obstetrics and Gynecology

## 2020-12-08 ENCOUNTER — Ambulatory Visit (INDEPENDENT_AMBULATORY_CARE_PROVIDER_SITE_OTHER): Payer: 59 | Admitting: Obstetrics and Gynecology

## 2020-12-08 ENCOUNTER — Other Ambulatory Visit: Payer: Self-pay

## 2020-12-08 VITALS — BP 120/80 | Ht 63.0 in | Wt 253.0 lb

## 2020-12-08 DIAGNOSIS — A599 Trichomoniasis, unspecified: Secondary | ICD-10-CM | POA: Diagnosis not present

## 2020-12-08 DIAGNOSIS — Z113 Encounter for screening for infections with a predominantly sexual mode of transmission: Secondary | ICD-10-CM

## 2020-12-08 NOTE — Patient Instructions (Signed)
I value your feedback and you entrusting us with your care. If you get a [facility] patient survey, I would appreciate you taking the time to let us know about your experience today. Thank you! ? ? ?

## 2020-12-12 LAB — CERVICOVAGINAL ANCILLARY ONLY
Chlamydia: NEGATIVE
Comment: NEGATIVE
Comment: NEGATIVE
Comment: NORMAL
Neisseria Gonorrhea: NEGATIVE
Trichomonas: NEGATIVE

## 2021-05-08 ENCOUNTER — Ambulatory Visit: Payer: Self-pay | Admitting: Obstetrics and Gynecology

## 2021-06-09 ENCOUNTER — Telehealth: Payer: Self-pay

## 2021-06-09 NOTE — Telephone Encounter (Signed)
Pt calling; has NOB 4/19th; having a brown d/c.  [phone]  Adv brown means old blood; nothing to worry about; when it's like a period that is cause for worry. ?

## 2021-06-15 ENCOUNTER — Emergency Department
Admission: EM | Admit: 2021-06-15 | Discharge: 2021-06-15 | Disposition: A | Discharge status: Home / Self Care | Payer: 59 | Attending: Emergency Medicine | Admitting: Emergency Medicine

## 2021-06-15 ENCOUNTER — Other Ambulatory Visit: Payer: Self-pay

## 2021-06-15 ENCOUNTER — Encounter: Payer: Self-pay | Admitting: Emergency Medicine

## 2021-06-15 ENCOUNTER — Emergency Department: Payer: 59

## 2021-06-15 DIAGNOSIS — R1032 Left lower quadrant pain: Secondary | ICD-10-CM | POA: Diagnosis not present

## 2021-06-15 DIAGNOSIS — O26891 Other specified pregnancy related conditions, first trimester: Secondary | ICD-10-CM | POA: Diagnosis present

## 2021-06-15 DIAGNOSIS — Z3A01 Less than 8 weeks gestation of pregnancy: Secondary | ICD-10-CM | POA: Diagnosis not present

## 2021-06-15 LAB — CBC WITH DIFFERENTIAL/PLATELET
Abs Immature Granulocytes: 0.02 10*3/uL (ref 0.00–0.07)
Basophils Absolute: 0.1 10*3/uL (ref 0.0–0.1)
Basophils Relative: 1 %
Eosinophils Absolute: 0.1 10*3/uL (ref 0.0–0.5)
Eosinophils Relative: 1 %
HCT: 36.4 % (ref 36.0–46.0)
Hemoglobin: 11.5 g/dL — ABNORMAL LOW (ref 12.0–15.0)
Immature Granulocytes: 0 %
Lymphocytes Relative: 30 %
Lymphs Abs: 2.7 10*3/uL (ref 0.7–4.0)
MCH: 25.6 pg — ABNORMAL LOW (ref 26.0–34.0)
MCHC: 31.6 g/dL (ref 30.0–36.0)
MCV: 80.9 fL (ref 80.0–100.0)
Monocytes Absolute: 0.6 10*3/uL (ref 0.1–1.0)
Monocytes Relative: 6 %
Neutro Abs: 5.8 10*3/uL (ref 1.7–7.7)
Neutrophils Relative %: 62 %
Platelets: 371 10*3/uL (ref 150–400)
RBC: 4.5 MIL/uL (ref 3.87–5.11)
RDW: 15 % (ref 11.5–15.5)
WBC: 9.3 10*3/uL (ref 4.0–10.5)
nRBC: 0 % (ref 0.0–0.2)

## 2021-06-15 LAB — COMPREHENSIVE METABOLIC PANEL
ALT: 10 U/L (ref 0–44)
AST: 14 U/L — ABNORMAL LOW (ref 15–41)
Albumin: 3.7 g/dL (ref 3.5–5.0)
Alkaline Phosphatase: 62 U/L (ref 38–126)
Anion gap: 8 (ref 5–15)
BUN: 9 mg/dL (ref 6–20)
CO2: 23 mmol/L (ref 22–32)
Calcium: 8.8 mg/dL — ABNORMAL LOW (ref 8.9–10.3)
Chloride: 102 mmol/L (ref 98–111)
Creatinine, Ser: 0.68 mg/dL (ref 0.44–1.00)
GFR, Estimated: >60 mL/min (ref >60)
Glucose, Bld: 98 mg/dL (ref 70–99)
Potassium: 3.8 mmol/L (ref 3.5–5.1)
Sodium: 133 mmol/L — ABNORMAL LOW (ref 135–145)
Total Bilirubin: 0.5 mg/dL (ref 0.3–1.2)
Total Protein: 7.8 g/dL (ref 6.5–8.1)

## 2021-06-15 LAB — URINALYSIS, ROUTINE W REFLEX MICROSCOPIC
Bilirubin Urine: NEGATIVE
Glucose, UA: NEGATIVE mg/dL
Ketones, ur: NEGATIVE mg/dL
Leukocytes,Ua: NEGATIVE
Nitrite: NEGATIVE
Protein, ur: NEGATIVE mg/dL
Specific Gravity, Urine: 1.005 (ref 1.005–1.030)
pH: 6 (ref 5.0–8.0)

## 2021-06-15 LAB — WET PREP, GENITAL
Clue Cells Wet Prep HPF POC: NONE SEEN
Sperm: NONE SEEN
Trich, Wet Prep: NONE SEEN
Yeast Wet Prep HPF POC: NONE SEEN

## 2021-06-15 LAB — ABO/RH: ABO/RH(D): B POS

## 2021-06-15 LAB — CHLAMYDIA/NGC RT PCR (ARMC ONLY)
Chlamydia Tr: NOT DETECTED
N gonorrhoeae: NOT DETECTED

## 2021-06-15 LAB — HCG, QUANTITATIVE, PREGNANCY: hCG, Beta Chain, Quant, S: 25714 m[IU]/mL — ABNORMAL HIGH (ref <5)

## 2021-06-15 NOTE — ED Triage Notes (Signed)
Patient ambulatory to triage with steady gait, without difficulty or distress noted; pt reports +home pregnancy test (approx 5wks) awoke with brown discharge and left lower abd pain ?

## 2021-06-15 NOTE — ED Provider Notes (Signed)
? ?  [facility] ?Provider Note ? ? ? Event Date/Time  ? First MD Initiated Contact with Patient 06/15/21 [phone]   ?  (approximate) ? ?History  ? ?Chief Complaint: Abdominal Pain ? ?HPI ? ?Lori Chan is a 26 y.o. female with no significant past medical history approximately [redacted] weeks pregnant who presents to the emergency department for vaginal discharge.  According to the patient her last menstrual period was February 19.  States she took a home pregnancy test that was positive.  Patient states she has developed some intermittent cramping in the left lower quadrant and since yesterday has been noticing a slight brown-colored discharge which she thought could be blood.  Denies any "pain."  This is the patient's first pregnancy. ? ?Physical Exam  ? ?Triage Vital Signs: ?ED Triage Vitals  ?Enc Vitals Group  ?   BP 06/15/21 0646 119/72  ?   Pulse Rate 06/15/21 0646 74  ?   Resp 06/15/21 0646 18  ?   Temp 06/15/21 0646 98.1 ?F (36.7 ?C)  ?   Temp Source 06/15/21 0646 Oral  ?   SpO2 06/15/21 0646 99 %  ?   Weight 06/15/21 0642 252 lb (114.3 kg)  ?   Height 06/15/21 0642 5\' 3"  (1.6 m)  ?   Head Circumference --   ?   Peak Flow --   ?   Pain Score 06/15/21 0642 7  ?   Pain Loc --   ?   Pain Edu? --   ?   Excl. in GC? --   ? ? ?Most recent vital signs: ?Vitals:  ? 06/15/21 0646  ?BP: 119/72  ?Pulse: 74  ?Resp: 18  ?Temp: 98.1 ?F (36.7 ?C)  ?SpO2: 99%  ? ? ?General: Awake, no distress.  ?CV:  Good peripheral perfusion.  Regular rate and rhythm  ?Resp:  Normal effort.  Equal breath sounds bilaterally.  ?Abd:  No distention.  Soft, nontender.  No rebound or guarding. ? ? ?RADIOLOGY ? ?Ultrasound shows single live intrauterine pregnancy at 5 weeks and 6 days. ? ? ?MEDICATIONS ORDERED IN ED: ?Medications - No data to display ? ? ?IMPRESSION / MDM / ASSESSMENT AND PLAN / ED COURSE  ?I reviewed the triage vital signs and the nursing notes. ? ?Patient presents to the emergency department for intermittent cramping  left lower quadrant with brown discharge.  Patient is approximately 5 or [redacted] weeks pregnant.  We will check labs, ultrasound and obtain swabs.  Differential would include threatened miscarriage, vaginal infection, normal discharge.  Patient agreeable to plan of care. ? ?I reviewed the patient's last OV/GYN note from 01/05/2021 which the patient saw Dr. 01/07/2021 at which time the patient was seen for vaginal discharge. ? ?Patient's results show a beta hCG of 25,000, urinalysis is normal.  Wet prep shows few white cells but no other concerning findings.  Chlamydia and gonorrhea negative.  Chemistry is normal.  CBC normal.  Patient is B+ blood type and does not require RhoGAM.  Ultrasound has resulted showing live intrauterine pregnancy of 5 weeks 6 days with a heart rate of 147 bpm.  Given the patient's reassuring work-up she will be discharged home with OB/GYN follow-up.  Patient agreeable to plan. ? ?FINAL CLINICAL IMPRESSION(S) / ED DIAGNOSES  ? ?Vaginal discharge ? ? ?Note:  This document was prepared using Dragon voice recognition software and may include unintentional dictation errors. ?  ?Feliberto Gottron, MD ?06/15/21 1023 ? ?

## 2021-06-30 DIAGNOSIS — U071 COVID-19: Secondary | ICD-10-CM

## 2021-06-30 HISTORY — DX: COVID-19: U07.1

## 2021-07-05 ENCOUNTER — Ambulatory Visit (INDEPENDENT_AMBULATORY_CARE_PROVIDER_SITE_OTHER): Payer: 59

## 2021-07-05 ENCOUNTER — Encounter: Payer: Self-pay | Admitting: Licensed Practical Nurse

## 2021-07-05 DIAGNOSIS — Z3490 Encounter for supervision of normal pregnancy, unspecified, unspecified trimester: Secondary | ICD-10-CM | POA: Insufficient documentation

## 2021-07-05 DIAGNOSIS — Z3689 Encounter for other specified antenatal screening: Secondary | ICD-10-CM

## 2021-07-05 DIAGNOSIS — Z3480 Encounter for supervision of other normal pregnancy, unspecified trimester: Secondary | ICD-10-CM

## 2021-07-05 NOTE — Progress Notes (Signed)
New OB Intake  I connected with  Lori Chan on 07/05/21 at  8:15 AM EDT by telephone Telephone Visit and verified that I am speaking with the correct person using two identifiers. Nurse is located at [facility] and pt is located at home.  I explained I am completing New OB Intake today. We discussed her EDD of 02/11/2022 that is based on LMP of 05/07/2021. Pt is G1/P0. I reviewed her allergies, medications, Medical/Surgical/OB history, and appropriate screenings. Based on history, this is a/an  pregnancy uncomplicated .   Patient Active Problem List   Diagnosis Date Noted   Trichomoniasis 11/13/2020    Concerns addressed today  Delivery Plans:  Plans to deliver at [facility].    [place] Explained first scheduled [place] will be scheduled at her first visit with provider. Labs Pt aware NOB labs will be drawn at first visit with provider.  Patient has covid vaccine.    Social Determinants of Health Food Insecurity: Patient denies food insecurity.  Transportation: Patient denies transportation needs.  Placed OB Box on problem list and updated  First visit review I reviewed new OB appt with pt. I explained she will have a pelvic exam, ob bloodwork with genetic screening, and PAP smear. Explained pt will be seen by Paula Compton, CNM at first visit; encounter routed to appropriate provider.    Loran Senters, CMA(AAMA) 07/05/2021  8:38 AM

## 2021-07-20 ENCOUNTER — Encounter: Payer: Self-pay | Admitting: Advanced Practice Midwife

## 2021-07-20 ENCOUNTER — Ambulatory Visit (INDEPENDENT_AMBULATORY_CARE_PROVIDER_SITE_OTHER): Payer: 59 | Admitting: Advanced Practice Midwife

## 2021-07-20 ENCOUNTER — Encounter: Payer: 59 | Admitting: Obstetrics

## 2021-07-20 VITALS — BP 128/80 | Wt 260.0 lb

## 2021-07-20 DIAGNOSIS — Z131 Encounter for screening for diabetes mellitus: Secondary | ICD-10-CM

## 2021-07-20 DIAGNOSIS — Z113 Encounter for screening for infections with a predominantly sexual mode of transmission: Secondary | ICD-10-CM

## 2021-07-20 DIAGNOSIS — Z1159 Encounter for screening for other viral diseases: Secondary | ICD-10-CM

## 2021-07-20 DIAGNOSIS — Z3A10 10 weeks gestation of pregnancy: Secondary | ICD-10-CM

## 2021-07-20 DIAGNOSIS — Z3401 Encounter for supervision of normal first pregnancy, first trimester: Secondary | ICD-10-CM

## 2021-07-20 DIAGNOSIS — Z130 Encounter for screening for diseases of the blood and blood-forming organs and certain disorders involving the immune mechanism: Secondary | ICD-10-CM

## 2021-07-20 DIAGNOSIS — O99210 Obesity complicating pregnancy, unspecified trimester: Secondary | ICD-10-CM | POA: Insufficient documentation

## 2021-07-20 DIAGNOSIS — O99211 Obesity complicating pregnancy, first trimester: Secondary | ICD-10-CM

## 2021-07-20 DIAGNOSIS — Z369 Encounter for antenatal screening, unspecified: Secondary | ICD-10-CM

## 2021-07-20 NOTE — Progress Notes (Signed)
? ?New Obstetric Patient H&P  ? ? ?Chief Complaint: "Desires prenatal care" ? ? ?History of Present Illness: Patient is a 26 y.o. G1P0000 Not Hispanic or Latino female, presents with amenorrhea and positive home pregnancy test. Patient's last menstrual period was 05/07/2021 (exact date). and based on her  LMP, her EDD is Estimated Date of Delivery: 02/11/22 and her EGA is [redacted]w[redacted]d. Cycles are 5-7 days, regular, and occur approximately every : 28 days. Her last pap smear was 2 years ago and was no abnormalities.  ?  ?She had a urine pregnancy test which was positive 6 week(s)  ago. Her last menstrual period was normal and lasted for  5 or 6 day(s). Since her LMP she claims she has experienced breast tenderness, fatigue, nausea, vomiting. She had vaginal spotting at 5 weeks and none since. Her past medical history is noncontributory. This is her first pregnancy. ? ?Since her LMP, she admits to the use of tobacco products  no ?She claims she has gained 7 pounds since the start of her pregnancy.  ?There are cats in the home in the home  no ?She admits close contact with children on a regular basis  no  ?She has had chicken pox in the past yes ?She has had Tuberculosis exposures, symptoms, or previously tested positive for TB   no ?Current or past history of domestic violence. no ? ?Genetic Screening/Teratology Counseling: (Includes patient, baby's father, or anyone in either family with:)  ? ?1. Patient's age >/= 69 at [facility]  no ?2. Thalassemia ([place], [place], [place], or Asian background): MCV<80  no ?3. Neural tube defect (meningomyelocele, spina bifida, anencephaly)  no ?4. Congenital heart defect  no  ?5. Down syndrome  no ?6. Tay-Sachs (Jewish, [place])  no ?7. Canavan's Disease  no ?8. Sickle cell disease or trait (African)  no  ?9. Hemophilia or other blood disorders  no  ?10. Muscular dystrophy  no  ?11. Cystic fibrosis  no  ?12. Huntington's Chorea  no  ?13. Mental retardation/autism  no ?14. Other  inherited genetic or chromosomal disorder  no ?15. Maternal metabolic disorder (DM, PKU, etc)  no ?16. Patient or FOB with a child with a birth defect not listed above no  ?16a. Patient or FOB with a birth defect themselves no ?17. Recurrent pregnancy loss, or stillbirth  no  ?18. Any medications since LMP other than prenatal vitamins (include vitamins, supplements, OTC meds, drugs, alcohol)  no ?19. Any other genetic/environmental exposure to discuss  no ? ?Infection History:  ? ?1. Lives with someone with TB or TB exposed  no  ?2. Patient or partner has history of genital herpes  no ?3. Rash or viral illness since LMP  no ?4. History of STI (GC, CT, HPV, syphilis, HIV)  Remote hx CT/Trich ?5. History of recent travel :  no ? ?Other pertinent information:  She and her boyfriend are no longer together. Her mom Roderic Ovens is her support person.  ? ? ? ?Review of Systems:10 point review of systems negative unless otherwise noted in HPI ? ?Past Medical History:  ?Patient Active Problem List  ? Diagnosis Date Noted  ? Supervision of normal pregnancy 07/05/2021  ? Trichomoniasis 11/13/2020  ? ? ?Past Surgical History:  ?Past Surgical History:  ?Procedure Laterality Date  ? WISDOM TOOTH EXTRACTION    ? ? ?Gynecologic History: Patient's last menstrual period was 05/07/2021 (exact date). ? ?Obstetric History: G1P0000 ? ?Family History:  ?Family History  ?Problem Relation Age of Onset  ?  Diabetes Mother   ? Congestive Heart Failure Father   ?     2022  ? Diabetes Maternal Grandmother   ? ? ?Social History:  ?Social History  ? ?Socioeconomic History  ? Marital status: Single  ?  Spouse name: Not on file  ? Number of children: 0  ? Years of education: 44  ? Highest education level: Not on file  ?Occupational History  ? Occupation: unemployed  ?Tobacco Use  ? Smoking status: Former  ?  Packs/day: 0.50  ?  Types: Cigarettes  ?  Quit date: 06/04/2021  ?  Years since quitting: 0.1  ? Smokeless tobacco: Never  ?Vaping Use  ? Vaping  Use: Never used  ?Substance and Sexual Activity  ? Alcohol use: No  ? Drug use: No  ? Sexual activity: Not Currently  ?  Birth control/protection: None  ?  Comment: same sex partner  ?Other Topics Concern  ? Not on file  ?Social History Narrative  ? Not on file  ? ?Social Determinants of Health  ? ?Financial Resource Strain: Low Risk   ? Difficulty of Paying Living Expenses: Not hard at all  ?Food Insecurity: No Food Insecurity  ? Worried About Charity fundraiser in the Last Year: Never true  ? Ran Out of Food in the Last Year: Never true  ?Transportation Needs: No Transportation Needs  ? Lack of Transportation (Medical): No  ? Lack of Transportation (Non-Medical): No  ?Physical Activity: Inactive  ? Days of Exercise per Week: 0 days  ? Minutes of Exercise per Session: 0 min  ?Stress: No Stress Concern Present  ? Feeling of Stress : Not at all  ?Social Connections: Socially Isolated  ? Frequency of Communication with Friends and Family: More than three times a week  ? Frequency of Social Gatherings with Friends and Family: Not on file  ? Attends Religious Services: Never  ? Active Member of Clubs or Organizations: No  ? Attends Archivist Meetings: Never  ? Marital Status: Divorced  ?Intimate Partner Violence: Not At Risk  ? Fear of Current or Ex-Partner: No  ? Emotionally Abused: No  ? Physically Abused: No  ? Sexually Abused: No  ? ? ?Allergies:  ?Allergies  ?Allergen Reactions  ? Ibuprofen Hives  ? Sulfa Antibiotics Hives  ? ? ?Medications: ?Prior to Admission medications   ?Not on File  ? ? ?Physical Exam ?Vitals: Blood pressure 128/80, weight 260 lb (117.9 kg), last menstrual period 05/07/2021. ? ?General: NAD ?HEENT: normocephalic, anicteric ?Thyroid: no enlargement, no palpable nodules ?Pulmonary: No increased work of breathing, CTAB ?Cardiovascular: RRR, distal pulses 2+ ?Abdomen: NABS, soft, non-tender, non-distended.  Umbilicus without lesions.  No hepatomegaly, splenomegaly or masses palpable.  No evidence of hernia  ?Extremities: no edema, erythema, or tenderness ?Neurologic: Grossly intact ?Psychiatric: mood appropriate, affect full ? ? ?The following were addressed during this visit: ? ?Breastfeeding Education ?- Early initiation of breastfeeding  ?  Comments: Keeps milk supply adequate, helps contract uterus and slow bleeding, and early milk is the perfect first food and is easy to digest. ? ? ?- The importance of exclusive breastfeeding  ?  Comments: Provides antibodies, Lower risk of breast and ovarian cancers, and type-2 diabetes,Helps your body recover, Reduced chance of SIDS. ? ? ?- Risks of giving your baby anything other than breast milk if you are breastfeeding  ?  Comments: Make the baby less content with breastfeeds, may make my baby more susceptible to illness, and may  reduce my milk supply. ? ? ?- The importance of early skin-to-skin contact  ?  Comments:  ?Keeps baby warm and secure, helps keep baby?s blood sugar up and breathing steady, easier to bond and breastfeed, and helps calm baby. ? ?- Rooming-in on a 24-hour basis  ?  Comments: Easier to learn baby's feeding cues, easier to bond and get to know each other, and encourages milk production. ? ? ?- Feeding on demand or baby-led feeding  ?  Comments: Helps prevent breastfeeding complications, helps bring in good milk supply, prevents under or overfeeding, and helps baby feel content and satisfied ? ? ?- Frequent feeding to help assure optimal milk production  ?  Comments: Making a full supply of milk requires frequent removal of milk from breasts, infant will eat 8-12 times in 24 hours, if separated from infant use breast massage, hand expression and/ or pumping to remove milk from breasts. ? ? ?- Effective positioning and attachment  ?  Comments: Helps my baby to get enough breast milk, helps to produce an adequate milk supply, and helps prevent nipple pain and damage ? ? ?- Exclusive breastfeeding for the first 6 months  ?  Comments:  Builds a healthy milk supply and keeps it up, protects baby from sickness and disease, and breastmilk has everything your baby needs for the first 6 months. ? ? ? ?Assessment: 26 y.o. G1P0000 at [redacted]w[redacted]d pr

## 2021-07-20 NOTE — Patient Instructions (Signed)
Exercise During Pregnancy ?Exercise is an important part of being healthy for people of all ages. Exercise improves the function of your heart and lungs and helps you maintain strength, flexibility, and a healthy body weight. Exercise also boosts energy levels and elevates mood. ?Most women should exercise regularly during pregnancy. Exercise routines may need to change as your pregnancy progresses. In rare cases, women with certain medical conditions or complications may be asked to limit or avoid exercise during pregnancy. Your health care provider will give you information on what will work for you. ?How does this affect me? ?Along with maintaining general strength and flexibility, exercising during pregnancy can help: ?Keep strength in muscles that are used during labor and childbirth. ?Decrease low back pain or symptoms of depression. ?Control weight gain during pregnancy. ?Reduce the risk of needing insulin if you develop diabetes during pregnancy. ?Decrease the risk of cesarean delivery. ?Speed up your recovery after giving birth. ?Relieve constipation. ?How does this affect my baby? ?Exercise can help you have a healthy pregnancy. Exercise does not cause early (premature) birth. It will not cause your baby to weigh less at birth. ?What exercises can I do? ?Many exercises are safe for you to do during pregnancy. Do a variety of exercises that safely increase your heart and breathing rates and help you build and maintain muscle strength. Do exercises exactly as told by your health care provider. You may do these exercises: ?Walking. ?Swimming. ?Water aerobics. ?Riding a stationary bike. ?Modified yoga or Pilates. Tell your instructor that you are pregnant. Avoid overstretching, and avoid lying on your back for long periods of time. ?Running or jogging. Choose this type of exercise only if: ?You ran or jogged regularly before your pregnancy. ?You can run or jog and still talk in complete sentences. ?What  exercises should I avoid? ?You may be told to limit high-intensity exercise depending on your level of fitness and whether you exercised regularly before you were pregnant. You can tell that you are exercising at a high intensity if you are breathing much harder and faster and cannot hold a conversation while exercising. You must avoid: ?Contact sports. ?Activities that put you at risk for falling on or being hit in the belly, such as downhill skiing, waterskiing, surfing, rock climbing, cycling, gymnastics, and horseback riding. ?Scuba diving. ?Skydiving. ?Hot yoga or hot Pilates. These activities take place in a room that is heated to high temperatures. ?Jogging or running, unless you jogged or ran regularly before you were pregnant. While jogging or running, you should always be able to talk in full sentences. Do not run or jog so fast that you are unable to have a conversation. ?Do not exercise at more than 6,000 feet above sea level (high elevation) if you are not used to exercising at high elevation. ?How do I exercise in a safe way? ? ?Avoid overheating. Do not exercise in very high temperatures. ?Wear loose-fitting, breathable clothes. ?Avoid dehydration. Drink enough fluid before, during, and after exercise to keep your urine pale yellow. ?Avoid overstretching. Because of hormone changes during pregnancy, it is easy to overstretch muscles, tendons, and ligaments. ?Start slowly and ask your health care provider to recommend the types of exercise that are safe for you. ?Do not exercise to lose weight. ?Wear a sports bra to support your breasts. ?Avoid standing still or lying flat on your back as much as you can. ?Follow these instructions at home: ?Exercise on most days or all days of the week. Try to   exercise for 30 minutes a day, 5 days a week, unless your health care provider tells you not to. ?If you actively exercised before your pregnancy and you are healthy, your health care provider may tell you to  continue to do moderate-intensity to high-intensity exercise. ?If you are just starting to exercise or did not exercise much before your pregnancy, your health care provider may tell you to do low-intensity to moderate-intensity exercise. ?Questions to ask your health care provider ?Is exercise safe for me? ?What are signs that I should stop exercising? ?Does my health condition mean that I should not exercise during pregnancy? ?When should I avoid exercising during pregnancy? ?Stop exercising and contact a health care provider if: ?You have any unusual symptoms such as: ?Mild contractions of the uterus or cramps in the abdomen. ?A dizzy feeling that does not go away when you rest. ?Stop exercising and get help right away if: ?You have any unusual symptoms such as: ?Sudden, severe pain in your low back or your belly. ?Regular, painful contractions of your uterus. ?Chest pain. ?Bleeding or fluid leaking from your vagina. ?Shortness of breath. ?Headache. ?Pain and swelling of your calves. ?Summary ?Most women should exercise regularly throughout pregnancy. In rare cases, women with certain medical conditions or complications may be asked to limit or avoid exercise during pregnancy. ?Do not exercise to lose weight during pregnancy. ?Your health care provider will tell you what level of physical activity is right for you. ?Stop exercising and contact a health care provider if you have unusual symptoms, such as mild contractions or dizziness. ?This information is not intended to replace advice given to you by your health care provider. Make sure you discuss any questions you have with your health care provider. ?Document Revised: 10/21/2019 Document Reviewed: 10/21/2019 ?Elsevier Patient Education ? 2023 [employer] ?Eating Plan for Pregnant Women ?While you are pregnant, your body requires additional nutrition to help support your growing baby. You also have a higher need for some vitamins and minerals, such as folic  acid, calcium, iron, and vitamin D. Eating a healthy, well-balanced diet is very important for your health and your baby's health. Your need for extra calories varies over the course of your pregnancy. Pregnancy is divided into three trimesters, with each trimester lasting 3 months. For most women, it is recommended to consume: ?150 extra calories a day during the first trimester. ?300 extra calories a day during the second trimester. ?300 extra calories a day during the third trimester. ?What are tips for following this plan? ?Cooking ?Practice good food safety and cleanliness. Wash your hands before you eat and after you prepare raw meat. Wash all fruits and vegetables well before peeling or eating. Taking these actions can help to prevent foodborne illnesses that can be very dangerous to your baby, such as listeriosis. Ask your health care provider for more information about listeriosis. ?Make sure that all meats, poultry, and eggs are cooked to food-safe temperatures or "well-done." ?Meal planning ? ?Eat a variety of foods (especially fruits and vegetables) to get a full range of vitamins and minerals. ?Two or more servings of fish are recommended each week in order to get the most benefits from omega-3 fatty acids that are found in seafood. Choose fish that are lower in mercury, such as salmon and pollock. ?Limit your overall intake of foods that have "empty calories." These are foods that have little nutritional value, such as sweets, desserts, candies, and sugar-sweetened beverages. ?Drinks that contain caffeine are okay  to drink, but it is better to avoid caffeine. Keep your total caffeine intake to less than 200 mg each day (which is 12 oz or 355 mL of coffee, tea, or soda) or the limit as told by your health care provider. ?General information ?Do not try to lose weight or go on a diet during pregnancy. ?Take a prenatal vitamin to help meet your additional vitamin and mineral needs during pregnancy,  specifically for folic acid, iron, calcium, and vitamin D. ?Remember to stay active. Ask your health care provider what types of exercise and activities are safe for you. ?What does 150 extra calories look like? ?H

## 2021-07-23 LAB — URINE CULTURE

## 2021-08-03 ENCOUNTER — Ambulatory Visit (INDEPENDENT_AMBULATORY_CARE_PROVIDER_SITE_OTHER): Payer: 59

## 2021-08-03 ENCOUNTER — Other Ambulatory Visit: Payer: Self-pay | Admitting: Advanced Practice Midwife

## 2021-08-03 DIAGNOSIS — Z3401 Encounter for supervision of normal first pregnancy, first trimester: Secondary | ICD-10-CM

## 2021-08-03 DIAGNOSIS — Z369 Encounter for antenatal screening, unspecified: Secondary | ICD-10-CM

## 2021-08-04 ENCOUNTER — Ambulatory Visit (INDEPENDENT_AMBULATORY_CARE_PROVIDER_SITE_OTHER): Payer: 59 | Admitting: Licensed Practical Nurse

## 2021-08-04 ENCOUNTER — Encounter: Payer: Self-pay | Admitting: Licensed Practical Nurse

## 2021-08-04 VITALS — BP 118/72 | Wt 267.0 lb

## 2021-08-04 DIAGNOSIS — Z1379 Encounter for other screening for genetic and chromosomal anomalies: Secondary | ICD-10-CM

## 2021-08-04 DIAGNOSIS — Z130 Encounter for screening for diseases of the blood and blood-forming organs and certain disorders involving the immune mechanism: Secondary | ICD-10-CM

## 2021-08-04 DIAGNOSIS — Z131 Encounter for screening for diabetes mellitus: Secondary | ICD-10-CM

## 2021-08-04 DIAGNOSIS — O99211 Obesity complicating pregnancy, first trimester: Secondary | ICD-10-CM

## 2021-08-04 DIAGNOSIS — O0990 Supervision of high risk pregnancy, unspecified, unspecified trimester: Secondary | ICD-10-CM

## 2021-08-04 DIAGNOSIS — Z113 Encounter for screening for infections with a predominantly sexual mode of transmission: Secondary | ICD-10-CM

## 2021-08-04 DIAGNOSIS — Z1159 Encounter for screening for other viral diseases: Secondary | ICD-10-CM

## 2021-08-04 DIAGNOSIS — Z3401 Encounter for supervision of normal first pregnancy, first trimester: Secondary | ICD-10-CM

## 2021-08-04 DIAGNOSIS — Z6841 Body Mass Index (BMI) 40.0 and over, adult: Secondary | ICD-10-CM

## 2021-08-04 DIAGNOSIS — Z369 Encounter for antenatal screening, unspecified: Secondary | ICD-10-CM

## 2021-08-04 DIAGNOSIS — Z3A12 12 weeks gestation of pregnancy: Secondary | ICD-10-CM

## 2021-08-04 DIAGNOSIS — Z0283 Encounter for blood-alcohol and blood-drug test: Secondary | ICD-10-CM

## 2021-08-04 LAB — POCT URINALYSIS DIPSTICK OB
Glucose, UA: NEGATIVE
POC,PROTEIN,UA: NEGATIVE

## 2021-08-04 NOTE — Progress Notes (Signed)
Routine Prenatal Care Visit  Subjective  Lori Chan is a 26 y.o. G1P0000 at [redacted]w[redacted]d being seen today for ongoing prenatal care.  She is currently monitored for the following issues for this high-risk pregnancy and has Trichomoniasis; Supervision of normal pregnancy; and Obesity affecting pregnancy on their problem list.  ----------------------------------------------------------------------------------- Patient reports nausea but improving. here with brother. Feeling pretty good.  Mood has been up and down but she is not concerned. Reminded to start daily baby ASA. Going to the beach in August. Has bene taking daily walks.  -reviewed plan of care, will see MFM for anatomy and then have weekly BPP later in the third trimester.  Contractions: Not present. Vag. Bleeding: None.  Movement: Absent. Leaking Fluid denies.  ----------------------------------------------------------------------------------- The following portions of the patient's history were reviewed and updated as appropriate: allergies, current medications, past family history, past medical history, past social history, past surgical history and problem list. Problem list updated.  Objective  Blood pressure 118/72, weight 267 lb (121.1 kg), last menstrual period 05/07/2021. Pregravid weight 253 lb (114.8 kg) Total Weight Gain 14 lb (6.35 kg) Urinalysis: Urine Protein Negative  Urine Glucose Negative  Fetal Status: Fetal Heart Rate (bpm): 150   Movement: Absent     General:  Alert, oriented and cooperative. Patient is in no acute distress.  Skin: Skin is warm and dry. No rash noted.   Cardiovascular: Normal heart rate noted  Respiratory: Normal respiratory effort, no problems with respiration noted  Abdomen: Soft, gravid, appropriate for gestational age. Pain/Pressure: Absent     Pelvic:  Cervical exam deferred        Extremities: Normal range of motion.  Edema: None  Mental Status: Normal mood and affect. Normal behavior. Normal  judgment and thought content.   Assessment   26 y.o. G1P0000 at [redacted]w[redacted]d by  02/09/2022, by Ultrasound presenting for routine prenatal visit  Plan   first Problems (from 07/05/21 to present)     Problem Noted Resolved   Obesity affecting pregnancy 07/20/2021 by Rod Can, CNM No   Supervision of normal pregnancy 07/05/2021 by Cleophas Dunker, [place] No   Overview Signed 07/20/2021 11:59 AM by Rod Can, CNM     Nursing Staff Provider  Office Location  [facility] Dating    Language  English Anatomy US    Flu Vaccine   Genetic Screen  NIPS:   TDaP vaccine    Hgb A1C or  GTT Early : early hgb a1c Third trimester :   Covid    LAB RESULTS   Rhogam   Blood Type --/--/B POS Performed at [facility], [address] [place], [place] [address]  [phone])   Feeding Plan  Antibody    Contraception  Rubella    Circumcision  RPR Non Reactive (07/05 1146)   Pediatrician   HBsAg     Support Person  HIV Non Reactive (07/05 1146)  Prenatal Classes  Varicella     GBS  (For PCN allergy, check sensitivities)   BTL Consent   GC/CT negative March '23  VBAC Consent NA Pap  negative 2021    Hgb Electro    Pelvis Tested NA CF      SMA                    general obstetric precautions including but not limited to vaginal bleeding, contractions, leaking of fluid and fetal movement were reviewed in detail with the patient. Please refer to After Visit Summary for other  counseling recommendations.   Return in about 4 weeks (around 09/01/2021) for ROB.  Prenatal labs and genetic screening collected   MFM consult/anatomy US ordered   Anesthesia consult ordered   Roberto Scales, [place], [facility]  08/04/21  10:44 AM

## 2021-08-05 LAB — URINE DRUG PANEL 7
Amphetamines, Urine: NEGATIVE ng/mL
Barbiturate Quant, Ur: NEGATIVE ng/mL
Benzodiazepine Quant, Ur: NEGATIVE ng/mL
Cannabinoid Quant, Ur: NEGATIVE ng/mL
Cocaine (Metab.): NEGATIVE ng/mL
Opiate Quant, Ur: NEGATIVE ng/mL
PCP Quant, Ur: NEGATIVE ng/mL

## 2021-08-07 LAB — CBC/D/PLT+RPR+RH+ABO+RUBIGG...
Antibody Screen: NEGATIVE
Basophils Absolute: 0 10*3/uL (ref 0.0–0.2)
Basos: 0 %
EOS (ABSOLUTE): 0.1 10*3/uL (ref 0.0–0.4)
Eos: 1 %
HCV Ab: NONREACTIVE
HIV Screen 4th Generation wRfx: NONREACTIVE
Hematocrit: 34.7 % (ref 34.0–46.6)
Hemoglobin: 11.7 g/dL (ref 11.1–15.9)
Hepatitis B Surface Ag: NEGATIVE
Immature Grans (Abs): 0 10*3/uL (ref 0.0–0.1)
Immature Granulocytes: 0 %
Lymphocytes Absolute: 2.4 10*3/uL (ref 0.7–3.1)
Lymphs: 21 %
MCH: 27.5 pg (ref 26.6–33.0)
MCHC: 33.7 g/dL (ref 31.5–35.7)
MCV: 82 fL (ref 79–97)
Monocytes Absolute: 1 10*3/uL — ABNORMAL HIGH (ref 0.1–0.9)
Monocytes: 9 %
Neutrophils Absolute: 7.9 10*3/uL — ABNORMAL HIGH (ref 1.4–7.0)
Neutrophils: 69 %
Platelets: 344 10*3/uL (ref 150–450)
RBC: 4.25 x10E6/uL (ref 3.77–5.28)
RDW: 14.6 % (ref 11.7–15.4)
RPR Ser Ql: NONREACTIVE
Rh Factor: POSITIVE
Rubella Antibodies, IGG: 2.76 index (ref >0.99)
Varicella zoster IgG: 214 index (ref >165)
WBC: 11.5 10*3/uL — ABNORMAL HIGH (ref 3.4–10.8)

## 2021-08-07 LAB — HGB A1C W/O EAG: Hgb A1c MFr Bld: 5.4 % (ref 4.8–5.6)

## 2021-08-07 LAB — HCV INTERPRETATION

## 2021-08-07 LAB — HGB FRACTIONATION CASCADE
Hgb A2: 2.2 % (ref 1.8–3.2)
Hgb A: 97.8 % (ref 96.4–98.8)
Hgb F: 0 % (ref 0.0–2.0)
Hgb S: 0 %

## 2021-08-10 LAB — MATERNIT 21 PLUS CORE, BLOOD
Fetal Fraction: 5
Result (T21): NEGATIVE
Trisomy 13 (Patau syndrome): NEGATIVE
Trisomy 18 (Edwards syndrome): NEGATIVE
Trisomy 21 (Down syndrome): NEGATIVE

## 2021-09-01 ENCOUNTER — Encounter: Payer: Self-pay | Admitting: Licensed Practical Nurse

## 2021-09-01 ENCOUNTER — Encounter: Payer: 59 | Admitting: Obstetrics

## 2021-09-01 ENCOUNTER — Ambulatory Visit (INDEPENDENT_AMBULATORY_CARE_PROVIDER_SITE_OTHER): Payer: 59 | Admitting: Licensed Practical Nurse

## 2021-09-01 VITALS — BP 118/70 | Wt 280.0 lb

## 2021-09-01 DIAGNOSIS — Z886 Allergy status to analgesic agent status: Secondary | ICD-10-CM

## 2021-09-01 DIAGNOSIS — Z3A17 17 weeks gestation of pregnancy: Secondary | ICD-10-CM

## 2021-09-01 DIAGNOSIS — Z3401 Encounter for supervision of normal first pregnancy, first trimester: Secondary | ICD-10-CM

## 2021-09-01 LAB — POCT URINALYSIS DIPSTICK OB
Glucose, UA: NEGATIVE
POC,PROTEIN,UA: NEGATIVE

## 2021-09-01 MED ORDER — ASPIRIN 81 MG PO CHEW
81.0000 mg | CHEWABLE_TABLET | Freq: Every day | ORAL | Status: DC
Start: 2021-09-01 — End: 2023-04-16

## 2021-09-01 NOTE — Progress Notes (Signed)
Routine Prenatal Care Visit  Subjective  Lori Chan is a 26 y.o. G1P0000 at [redacted]w[redacted]d being seen today for ongoing prenatal care.  She is currently monitored for the following issues for this high-risk pregnancy and has Trichomoniasis; Supervision of normal pregnancy; and Obesity affecting pregnancy on their problem list.  ----------------------------------------------------------------------------------- Patient reports no complaints.  Doing well.  Feels good, energy has been good. Mood is good. Her mother will be her main support person in labor, FOB will be present. Encouraged pt to read about pregnancy/labor and birth.  -plans to breastfeed -started baby ASA, pt allergic to Ibuprofen, has sneezes but when she takes too much she gets hives, has been sneezing with the ASA.  -reviewed wt gain, encouraged pt to to try to maintain current weight, pt is walking 3-7 times a week.  -Anatomy US next month  Contractions: Not present. Vag. Bleeding: None.  Movement: Absent. Leaking Fluid denies.  ----------------------------------------------------------------------------------- The following portions of the patient's history were reviewed and updated as appropriate: allergies, current medications, past family history, past medical history, past social history, past surgical history and problem list. Problem list updated.  Objective  Blood pressure 118/70, weight 280 lb (127 kg), last menstrual period 05/07/2021. Pregravid weight 253 lb (114.8 kg) Total Weight Gain 27 lb (12.2 kg) Urinalysis: Urine Protein    Urine Glucose    Fetal Status: Fetal Heart Rate (bpm): 140   Movement: Absent     General:  Alert, oriented and cooperative. Patient is in no acute distress.  Skin: Skin is warm and dry. No rash noted.   Cardiovascular: Normal heart rate noted  Respiratory: Normal respiratory effort, no problems with respiration noted  Abdomen: Soft, gravid, appropriate for gestational age. Pain/Pressure:  Absent     Pelvic:  Cervical exam deferred        Extremities: Normal range of motion.  Edema: Trace  Mental Status: Normal mood and affect. Normal behavior. Normal judgment and thought content.   Assessment   26 y.o. G1P0000 at [redacted]w[redacted]d by  02/09/2022, by Ultrasound presenting for routine prenatal visit  Plan   first Problems (from 07/05/21 to present)     Problem Noted Resolved   Obesity affecting pregnancy 07/20/2021 by Tresea Mall, CNM No   Supervision of normal pregnancy 07/05/2021 by Loran Senters, CMA No   Overview Addendum 08/04/2021 10:47 AM by Ellwood Sayers, CNM     Nursing Staff Provider  Office Location  [facility] Dating  [redacted]w[redacted]d on 3/30   Language  English Anatomy US    Flu Vaccine   Genetic Screen  NIPS:   TDaP vaccine    Hgb A1C or  GTT Early : early hgb a1c Third trimester :   Covid vaccianted   LAB RESULTS   Rhogam  NA Blood Type --/--/B POS Performed at [facility], [address] [place], [place] [address]  [phone])   Feeding Plan  Antibody    Contraception  Rubella    Circumcision  RPR Non Reactive (07/05 1146)   Pediatrician   HBsAg     Support Person Family  HIV Non Reactive (07/05 1146)  Prenatal Classes  Varicella     GBS  (For PCN allergy, check sensitivities)   BTL Consent   GC/CT negative March '23  VBAC Consent NA Pap  negative 2021    Hgb Electro    Pelvis Tested NA CF      SMA         BMI >=40 [ ]   early 1h gtt -  [ ]  screen sleep apnea [ ]  anesthesia consult (early and late if BMI > 45) [x ] u/s for dating [ ]   [ ]  nutritional goals [ ]  folic acid 1mg  [ ]  bASA (>12 weeks) [ ]  consider nutrition consult [ ]  consider maternal EKG 1st trimester [ ]  Growth u/s 28 [ ] , 32 [ ] , 36 weeks [ ]  [ ]  NST/AFI weekly 34+ weeks (34[] ,35[] ,36[] , 37[] , 38[] , 39[] , 40[] ) [ ]  IOL by 40 weeks (scheduled, prn [] )           general obstetric precautions including but not limited to vaginal bleeding, contractions, leaking of fluid  and fetal movement were reviewed in detail with the patient. Please refer to After Visit Summary for other counseling recommendations.   Return in about 4 weeks (around 09/29/2021) for ROB.  Reviewed Ibuprofen allergy with daily baby ASA use with Dr , ok to continue baby ASA and refer pt to allergist to determine exact NSAID allergy to see what she could use PP for pain.    , CNM  , [facility]  09/01/21  10:04 AM

## 2021-09-29 ENCOUNTER — Ambulatory Visit (INDEPENDENT_AMBULATORY_CARE_PROVIDER_SITE_OTHER): Payer: 59 | Admitting: Licensed Practical Nurse

## 2021-09-29 ENCOUNTER — Encounter: Payer: Self-pay | Admitting: Licensed Practical Nurse

## 2021-09-29 VITALS — BP 100/80 | Wt 288.0 lb

## 2021-09-29 DIAGNOSIS — O0990 Supervision of high risk pregnancy, unspecified, unspecified trimester: Secondary | ICD-10-CM

## 2021-09-29 DIAGNOSIS — Z3A21 21 weeks gestation of pregnancy: Secondary | ICD-10-CM

## 2021-09-29 LAB — POCT URINALYSIS DIPSTICK OB
Glucose, UA: NEGATIVE
POC,PROTEIN,UA: NEGATIVE

## 2021-09-29 NOTE — Progress Notes (Unsigned)
ROB- no concerns 

## 2021-10-02 NOTE — Progress Notes (Signed)
Routine Prenatal Care Visit  Subjective  Lori Chan is a 26 y.o. G1P0000 at [redacted]w[redacted]d being seen today for ongoing prenatal care.  She is currently monitored for the following issues for this high-risk pregnancy and has Trichomoniasis; Supervision of normal pregnancy; and Obesity affecting pregnancy on their problem list.  ----------------------------------------------------------------------------------- Patient reports no complaints.  Here with her mom. Feeling good. Is taking daily ASA, has some sneezes but not other signs of an allergic reaction. Has [place] on 7/18 Contractions: Not present. Vag. Bleeding: None.  Movement: Present. Leaking Fluid denies.  ----------------------------------------------------------------------------------- The following portions of the patient's history were reviewed and updated as appropriate: allergies, current medications, past family history, past medical history, past social history, past surgical history and problem list. Problem list updated.  Objective  Blood pressure 100/80, weight 288 lb (130.6 kg), last menstrual period 05/07/2021. Pregravid weight 253 lb (114.8 kg) Total Weight Gain 35 lb (15.9 kg) Urinalysis: Urine Protein Negative  Urine Glucose Negative  Fetal Status: Fetal Heart Rate (bpm): 155   Movement: Present     General:  Alert, oriented and cooperative. Patient is in no acute distress.  Skin: Skin is warm and dry. No rash noted.   Cardiovascular: Normal heart rate noted  Respiratory: Normal respiratory effort, no problems with respiration noted  Abdomen: Soft, gravid, appropriate for gestational age. Pain/Pressure: Absent     Pelvic:  Cervical exam deferred        Extremities: Normal range of motion.  Edema: Trace  Mental Status: Normal mood and affect. Normal behavior. Normal judgment and thought content.   Assessment   26 y.o. G1P0000 at [redacted]w[redacted]d by  02/09/2022, by Ultrasound presenting for routine prenatal visit  Plan   first  Problems (from 07/05/21 to present)     Problem Noted Resolved   Obesity affecting pregnancy 07/20/2021 by Tresea Mall, CNM No   Supervision of normal pregnancy 07/05/2021 by Loran Senters, CMA No   Overview Addendum 08/04/2021 10:47 AM by Ellwood Sayers, CNM     Nursing Staff Provider  Office Location  [facility] Dating  [redacted]w[redacted]d on 3/30   Language  English Anatomy US    Flu Vaccine   Genetic Screen  NIPS:   TDaP vaccine    Hgb A1C or  GTT Early : early hgb a1c Third trimester :   Covid vaccianted   LAB RESULTS   Rhogam  NA Blood Type --/--/B POS Performed at [facility], [address] [place], [place] [address]  [phone])   Feeding Plan  Antibody    Contraception  Rubella    Circumcision  RPR Non Reactive (07/05 1146)   Pediatrician   HBsAg     Support Person Family  HIV Non Reactive (07/05 1146)  Prenatal Classes  Varicella     GBS  (For PCN allergy, check sensitivities)   BTL Consent   GC/CT negative March '23  VBAC Consent NA Pap  negative 2021    Hgb Electro    Pelvis Tested NA CF      SMA         BMI >=40 [ ]  early 1h gtt -  [ ]  screen sleep apnea [ ]  anesthesia consult (early and late if BMI > 45) [x ] u/s for dating [ ]   [ ]  nutritional goals [ ]  folic acid 1mg  [ ]  bASA (>12 weeks) [ ]  consider nutrition consult [ ]  consider maternal EKG 1st trimester [ ]  Growth u/s 28 [ ] , 32 [ ] ,  36 weeks [ ]  [ ]  NST/AFI weekly 34+ weeks (34[] ,35[] ,36[] , 37[] , 38[] , 39[] , 40[] ) [ ]  IOL by 41 weeks (scheduled, prn [] )           Preterm labor symptoms and general obstetric precautions including but not limited to vaginal bleeding, contractions, leaking of fluid and fetal movement were reviewed in detail with the patient. Please refer to After Visit Summary for other counseling recommendations.   Return in about 4 weeks (around 10/27/2021) for ROB.  , CNM  , [facility]  10/02/21  10:38 AM

## 2021-10-03 ENCOUNTER — Other Ambulatory Visit: Payer: Self-pay

## 2021-10-03 ENCOUNTER — Ambulatory Visit: Payer: Medicaid Other | Attending: Obstetrics and Gynecology

## 2021-10-03 VITALS — BP 123/75 | HR 92 | Temp 98.3°F | Ht 62.0 in | Wt 286.5 lb

## 2021-10-03 DIAGNOSIS — Z3402 Encounter for supervision of normal first pregnancy, second trimester: Secondary | ICD-10-CM

## 2021-10-03 DIAGNOSIS — Z3A21 21 weeks gestation of pregnancy: Secondary | ICD-10-CM | POA: Diagnosis not present

## 2021-10-03 DIAGNOSIS — O358XX0 Maternal care for other (suspected) fetal abnormality and damage, not applicable or unspecified: Secondary | ICD-10-CM | POA: Diagnosis not present

## 2021-10-03 DIAGNOSIS — O321XX0 Maternal care for breech presentation, not applicable or unspecified: Secondary | ICD-10-CM | POA: Diagnosis not present

## 2021-10-03 DIAGNOSIS — Z363 Encounter for antenatal screening for malformations: Secondary | ICD-10-CM | POA: Insufficient documentation

## 2021-10-03 DIAGNOSIS — E669 Obesity, unspecified: Secondary | ICD-10-CM

## 2021-10-03 DIAGNOSIS — O99212 Obesity complicating pregnancy, second trimester: Secondary | ICD-10-CM | POA: Diagnosis not present

## 2021-10-03 DIAGNOSIS — O0990 Supervision of high risk pregnancy, unspecified, unspecified trimester: Secondary | ICD-10-CM

## 2021-10-26 ENCOUNTER — Other Ambulatory Visit: Payer: Self-pay

## 2021-10-26 DIAGNOSIS — O99212 Obesity complicating pregnancy, second trimester: Secondary | ICD-10-CM

## 2021-10-27 ENCOUNTER — Ambulatory Visit (INDEPENDENT_AMBULATORY_CARE_PROVIDER_SITE_OTHER): Payer: 59 | Admitting: Obstetrics

## 2021-10-27 VITALS — BP 100/70 | Wt 296.0 lb

## 2021-10-27 DIAGNOSIS — Z3A25 25 weeks gestation of pregnancy: Secondary | ICD-10-CM

## 2021-10-27 DIAGNOSIS — O99213 Obesity complicating pregnancy, third trimester: Secondary | ICD-10-CM

## 2021-10-27 DIAGNOSIS — O0990 Supervision of high risk pregnancy, unspecified, unspecified trimester: Secondary | ICD-10-CM

## 2021-10-27 LAB — POCT URINALYSIS DIPSTICK OB
Glucose, UA: NEGATIVE
POC,PROTEIN,UA: NEGATIVE

## 2021-10-27 NOTE — Progress Notes (Signed)
Routine Prenatal Care Visit  Subjective  Lori Chan is a 26 y.o. G1P0000 at [redacted]w[redacted]d being seen today for ongoing prenatal care.  She is currently monitored for the following issues for this high-risk pregnancy and has Trichomoniasis; Supervision of normal pregnancy; and Obesity affecting pregnancy on their problem list.  ----------------------------------------------------------------------------------- Patient reports no complaints.  She is taking a daily ASA. Had sono with MFM and the growth has been appropriate. Has not been set up for anesthesia consultation. Contractions: Not present. Vag. Bleeding: None.  Movement: Present. Leaking Fluid denies.  ----------------------------------------------------------------------------------- The following portions of the patient's history were reviewed and updated as appropriate: allergies, current medications, past family history, past medical history, past social history, past surgical history and problem list. Problem list updated.  Objective  Blood pressure 100/70, weight 296 lb (134.3 kg), last menstrual period 05/07/2021. Pregravid weight 253 lb (114.8 kg) Total Weight Gain 43 lb (19.5 kg) Urinalysis: Urine Protein Negative  Urine Glucose Negative  Fetal Status:     Movement: Present     General:  Alert, oriented and cooperative. Patient is in no acute distress.  Skin: Skin is warm and dry. No rash noted.   Cardiovascular: Normal heart rate noted  Respiratory: Normal respiratory effort, no problems with respiration noted  Abdomen: Soft, gravid, appropriate for gestational age. Pain/Pressure: Absent     Pelvic:  Cervical exam deferred        Extremities: Normal range of motion.     Mental Status: Normal mood and affect. Normal behavior. Normal judgment and thought content.   Assessment   26 y.o. G1P0000 at [redacted]w[redacted]d by  02/09/2022, by Ultrasound presenting for routine prenatal visit  Plan   first Problems (from 07/05/21 to present)     Problem Noted Resolved   Obesity affecting pregnancy 07/20/2021 by Tresea Mall, CNM No   Supervision of normal pregnancy 07/05/2021 by Loran Senters, CMA No   Overview Addendum 08/04/2021 10:47 AM by Ellwood Sayers, CNM     Nursing Staff Provider  Office Location  [facility] Dating  [redacted]w[redacted]d on 3/30   Language  English Anatomy US    Flu Vaccine   Genetic Screen  NIPS:   TDaP vaccine    Hgb A1C or  GTT Early : early hgb a1c Third trimester :   Covid vaccianted   LAB RESULTS   Rhogam  NA Blood Type --/--/B POS Performed at [facility], [address] [place], [place] [address]  [phone])   Feeding Plan  Antibody    Contraception  Rubella    Circumcision  RPR Non Reactive (07/05 1146)   Pediatrician   HBsAg     Support Person Family  HIV Non Reactive (07/05 1146)  Prenatal Classes  Varicella     GBS  (For PCN allergy, check sensitivities)   BTL Consent   GC/CT negative March '23  VBAC Consent NA Pap  negative 2021    Hgb Electro    Pelvis Tested NA CF      SMA         BMI >=40 [ ]  early 1h gtt -  [ ]  screen sleep apnea [ ]  anesthesia consult (early and late if BMI > 45) [x ] u/s for dating [ ]   [ ]  nutritional goals [ ]  folic acid 1mg  [ ]  bASA (>12 weeks) [ ]  consider nutrition consult [ ]  consider maternal EKG 1st trimester [ ]  Growth u/s 28 [ ] , 32 [ ] , 36 weeks [ ]  [ ]   NST/AFI weekly 34+ weeks (34[] ,35[] ,36[] , 37[] , 38[] , 39[] , 40[] ) [ ]  IOL by 41 weeks (scheduled, prn [] )          Preterm labor symptoms and general obstetric precautions including but not limited to vaginal bleeding, contractions, leaking of fluid and fetal movement were reviewed in detail with the patient. Please refer to After Visit Summary for other counseling recommendations.  Discussed her monthly growth scans due to high BMI. Encouraged her to walk daily and keep her overall wt gain lower. Anesthesia consult is ordered .  No follow-ups on file.  , CNM   10/27/2021 9:49 AM

## 2021-10-31 ENCOUNTER — Other Ambulatory Visit: Payer: Self-pay

## 2021-10-31 ENCOUNTER — Ambulatory Visit: Payer: Medicaid Other | Attending: Obstetrics and Gynecology

## 2021-10-31 VITALS — BP 105/55 | HR 96 | Temp 98.5°F | Ht 63.0 in | Wt 297.0 lb

## 2021-10-31 DIAGNOSIS — Z362 Encounter for other antenatal screening follow-up: Secondary | ICD-10-CM | POA: Insufficient documentation

## 2021-10-31 DIAGNOSIS — Z3402 Encounter for supervision of normal first pregnancy, second trimester: Secondary | ICD-10-CM

## 2021-10-31 DIAGNOSIS — O99212 Obesity complicating pregnancy, second trimester: Secondary | ICD-10-CM | POA: Insufficient documentation

## 2021-10-31 DIAGNOSIS — E669 Obesity, unspecified: Secondary | ICD-10-CM | POA: Diagnosis not present

## 2021-10-31 DIAGNOSIS — O358XX0 Maternal care for other (suspected) fetal abnormality and damage, not applicable or unspecified: Secondary | ICD-10-CM | POA: Diagnosis not present

## 2021-10-31 DIAGNOSIS — Z3A25 25 weeks gestation of pregnancy: Secondary | ICD-10-CM | POA: Insufficient documentation

## 2021-11-09 ENCOUNTER — Other Ambulatory Visit: Payer: 59

## 2021-11-09 ENCOUNTER — Ambulatory Visit (INDEPENDENT_AMBULATORY_CARE_PROVIDER_SITE_OTHER): Payer: 59 | Admitting: Obstetrics

## 2021-11-09 VITALS — BP 112/78 | Wt 301.4 lb

## 2021-11-09 DIAGNOSIS — O0990 Supervision of high risk pregnancy, unspecified, unspecified trimester: Secondary | ICD-10-CM

## 2021-11-09 DIAGNOSIS — Z3A26 26 weeks gestation of pregnancy: Secondary | ICD-10-CM

## 2021-11-09 DIAGNOSIS — Z3480 Encounter for supervision of other normal pregnancy, unspecified trimester: Secondary | ICD-10-CM

## 2021-11-09 NOTE — Progress Notes (Signed)
Routine Prenatal Care Visit  Subjective  Lori Chan is a 26 y.o. G1P0000 at [redacted]w[redacted]d being seen today for ongoing prenatal care.  She is currently monitored for the following issues for this high-risk pregnancy and has Trichomoniasis; Supervision of normal pregnancy; and Obesity affecting pregnancy on their problem list.  ----------------------------------------------------------------------------------- Patient reports no complaints.  hjaving her 28 wk labs today. She has gained 4 lbs since last visit. BMI is now 53. Contractions: Not present. Vag. Bleeding: None.  Movement: Present. Leaking Fluid denies.  ----------------------------------------------------------------------------------- The following portions of the patient's history were reviewed and updated as appropriate: allergies, current medications, past family history, past medical history, past social history, past surgical history and problem list. Problem list updated.  Objective  Blood pressure 112/78, weight (!) 301 lb 6.4 oz (136.7 kg), last menstrual period 05/07/2021. Pregravid weight 253 lb (114.8 kg) Total Weight Gain 48 lb 6.4 oz (22 kg) Urinalysis: Urine Protein    Urine Glucose    Fetal Status:     Movement: Present     General:  Alert, oriented and cooperative. Patient is in no acute distress.  Skin: Skin is warm and dry. No rash noted.   Cardiovascular: Normal heart rate noted  Respiratory: Normal respiratory effort, no problems with respiration noted  Abdomen: Soft, gravid, appropriate for gestational age. Pain/Pressure: Absent     Pelvic:  Cervical exam deferred        Extremities: Normal range of motion.     Mental Status: Normal mood and affect. Normal behavior. Normal judgment and thought content.   Assessment   26 y.o. G1P0000 at [redacted]w[redacted]d by  02/09/2022, by Ultrasound presenting for routine prenatal visit  Plan   first Problems (from 07/05/21 to present)    Problem Noted Resolved   Obesity affecting  pregnancy 07/20/2021 by Tresea Mall, CNM No   Supervision of normal pregnancy 07/05/2021 by Loran Senters, CMA No   Overview Addendum 11/09/2021  9:09 AM by Mirna Mires, CNM     Nursing Staff Provider  Office Location  [facility] Dating  [redacted]w[redacted]d on 3/30   Language  English Anatomy US    Flu Vaccine   Genetic Screen  NIPS:   TDaP vaccine    Hgb A1C or  GTT Early : early hgb a1c Third trimester :   Covid vaccianted   LAB RESULTS   Rhogam  NA Blood Type B/Positive/-- (05/19 1041)   Feeding Plan  Antibody Negative (05/19 1041)  Contraception  Rubella 2.76 (05/19 1041)  Circumcision  RPR Non Reactive (05/19 1041)   Pediatrician   HBsAg Negative (05/19 1041)   Support Person Family  HIV Non Reactive (05/19 1041)  Prenatal Classes  Varicella     GBS  (For PCN allergy, check sensitivities)   BTL Consent   GC/CT negative March '23  VBAC Consent NA Pap  negative 2021    Hgb Electro    Pelvis Tested NA CF      SMA         BMI >=40 [ ]  early 1h gtt -  [ ]  screen sleep apnea [ ]  anesthesia consult (early and late if BMI > 45) [x ] u/s for dating [ ]   [ ]  nutritional goals [ ]  folic acid 1mg  [ ]  bASA (>12 weeks) [ ]  consider nutrition consult [ ]  consider maternal EKG 1st trimester [ ]  Growth u/s 28 [ ] , 32 [ ] , 36 weeks [ ]  [ ]  NST/AFI weekly 34+ weeks (34[] ,35[] ,36[] , 37[] , 38[] , 39[] ,  40[] ) [ ]  IOL by 41 weeks (scheduled, prn [] )          Preterm labor symptoms and general obstetric precautions including but not limited to vaginal bleeding, contractions, leaking of fluid and fetal movement were reviewed in detail with the patient. Please refer to After Visit Summary for other counseling recommendations.  We addressed her weight gain and high BMI. Advised her to drink only zero calorie beverages and reduce her intake of pasta, potatoes, rice and breads. Increase her walking. She has also decided she will likely not breastfeed. I reviewed the benfits of even shorterm nursing with  her today. She has growth scans set up and an anesthesia visit.  Return in about 2 weeks (around 11/23/2021) for return OB.  , CNM  11/09/2021 9:10 AM

## 2021-11-09 NOTE — Progress Notes (Signed)
ROB.Patient states no questions or concerns at this time.  

## 2021-11-10 LAB — 28 WEEK RH+PANEL
Basophils Absolute: 0 10*3/uL (ref 0.0–0.2)
Basos: 0 %
EOS (ABSOLUTE): 0.1 10*3/uL (ref 0.0–0.4)
Eos: 1 %
Gestational Diabetes Screen: 128 mg/dL (ref 70–139)
HIV Screen 4th Generation wRfx: NONREACTIVE
Hematocrit: 33.5 % — ABNORMAL LOW (ref 34.0–46.6)
Hemoglobin: 10.9 g/dL — ABNORMAL LOW (ref 11.1–15.9)
Immature Grans (Abs): 0.1 10*3/uL (ref 0.0–0.1)
Immature Granulocytes: 1 %
Lymphocytes Absolute: 2 10*3/uL (ref 0.7–3.1)
Lymphs: 18 %
MCH: 28.5 pg (ref 26.6–33.0)
MCHC: 32.5 g/dL (ref 31.5–35.7)
MCV: 88 fL (ref 79–97)
Monocytes Absolute: 0.6 10*3/uL (ref 0.1–0.9)
Monocytes: 5 %
Neutrophils Absolute: 8.3 10*3/uL — ABNORMAL HIGH (ref 1.4–7.0)
Neutrophils: 75 %
Platelets: 303 10*3/uL (ref 150–450)
RBC: 3.82 x10E6/uL (ref 3.77–5.28)
RDW: 13.6 % (ref 11.7–15.4)
RPR Ser Ql: NONREACTIVE
WBC: 11 10*3/uL — ABNORMAL HIGH (ref 3.4–10.8)

## 2021-11-23 ENCOUNTER — Encounter
Admission: RE | Admit: 2021-11-23 | Discharge: 2021-11-23 | Disposition: A | Discharge status: Home / Self Care | Payer: 59 | Source: Ambulatory Visit | Attending: Anesthesiology | Admitting: Anesthesiology

## 2021-11-23 ENCOUNTER — Encounter: Payer: Self-pay | Admitting: Anesthesiology

## 2021-11-23 ENCOUNTER — Other Ambulatory Visit: Payer: Self-pay

## 2021-11-23 ENCOUNTER — Ambulatory Visit (INDEPENDENT_AMBULATORY_CARE_PROVIDER_SITE_OTHER): Payer: 59 | Admitting: Obstetrics & Gynecology

## 2021-11-23 ENCOUNTER — Encounter: Payer: Self-pay | Admitting: Obstetrics & Gynecology

## 2021-11-23 VITALS — BP 120/80 | HR 102 | Wt 305.6 lb

## 2021-11-23 DIAGNOSIS — Z3A28 28 weeks gestation of pregnancy: Secondary | ICD-10-CM | POA: Diagnosis not present

## 2021-11-23 DIAGNOSIS — Z23 Encounter for immunization: Secondary | ICD-10-CM | POA: Diagnosis not present

## 2021-11-23 DIAGNOSIS — Z3403 Encounter for supervision of normal first pregnancy, third trimester: Secondary | ICD-10-CM | POA: Diagnosis not present

## 2021-11-23 DIAGNOSIS — O99213 Obesity complicating pregnancy, third trimester: Secondary | ICD-10-CM

## 2021-11-23 LAB — POCT URINALYSIS DIPSTICK OB
Bilirubin, UA: NEGATIVE
Blood, UA: NEGATIVE
Glucose, UA: NEGATIVE
Ketones, UA: POSITIVE
Nitrite, UA: NEGATIVE
Spec Grav, UA: 1.02 (ref 1.010–1.025)
Urobilinogen, UA: 0.2 E.U./dL
pH, UA: 6 (ref 5.0–8.0)

## 2021-11-23 NOTE — Progress Notes (Signed)
ROB: She has been having some pains in her groin. She has pain when she wakes up in the morning, when she starts moving around pain resolves. BTC form signed and TDAP given today.

## 2021-11-23 NOTE — Consult Note (Addendum)
Anesthesiology consult note ( Ambulatory referral to OB anesthesia for morbid obesity) :  I had the distinct pleasure of meeting Lori Chan  today for an OB anesthesia precheck.  She has a history of morbid obesity with a BMI today of 53.  Patient reports no previous problems with anesthesia, no problems with her back and has a MP score of IV.  We discussed the risks of Obesity in Pregnancy including but not limited to: Patients with a BMI > 40 are at increased risk for complications during pregnancy, such as hypertensive disorders of pregnancy, gestational diabetes, obstructive sleep apnea, thromboembolic disease, prolonged labor, operative vaginal delivery and need for cesarean delivery. There is an increased risk of airway complications, including inability to place a breathing tube, should an emergency cesarean delivery be required. There is an increased risk of aspiration, where stomach contents get into the lungs and can cause inflammation, infection and even respiratory failure. Epidural placement is more difficult in obesity, often requires multiple attempts, more often results in inadequate pain relief, more often requires replacement due to movement of the epidural catheter and more often results in accidental dural puncture and post dural puncture headache.  It was discussed with her that she is at high risk for complications due to being a potential difficult airway in the event an emergent cesarean section is needed. Her epidural/spinal has a high chance of being difficult. Therefore, we advise patient to labor at a tertiary facility with more airway support offered. The OB charge nurse was notified and will communicate this with the OB team.  Patient voiced understanding acknowledging.   Nelta Numbers, MD Anesthesia

## 2021-11-24 ENCOUNTER — Other Ambulatory Visit: Payer: Self-pay | Admitting: Licensed Practical Nurse

## 2021-11-24 DIAGNOSIS — O0990 Supervision of high risk pregnancy, unspecified, unspecified trimester: Secondary | ICD-10-CM

## 2021-11-24 NOTE — Progress Notes (Signed)
Pt had Anesthesia consult, Their recommendation is to give birth at a tertiary care center such as [facility]. Discussed the benefits of transferring care now so that Lori Chan can establish a relationship with the providers that will be with her during her labor and birth. Lori Chan agreeable with plan.  Will keep all apts with [facility] until she starts her care at [facility].  Referral to [facility] made. Carie Caddy, CNM  Domingo Pulse, [facility]  11/24/21  4:52 PM

## 2021-11-28 ENCOUNTER — Other Ambulatory Visit: Payer: Self-pay

## 2021-11-28 ENCOUNTER — Ambulatory Visit: Payer: 59 | Attending: Obstetrics and Gynecology

## 2021-11-28 VITALS — BP 110/73 | HR 104 | Temp 97.9°F | Ht 63.0 in | Wt 304.5 lb

## 2021-11-28 DIAGNOSIS — E669 Obesity, unspecified: Secondary | ICD-10-CM

## 2021-11-28 DIAGNOSIS — O99213 Obesity complicating pregnancy, third trimester: Secondary | ICD-10-CM | POA: Diagnosis not present

## 2021-11-28 DIAGNOSIS — Z3A29 29 weeks gestation of pregnancy: Secondary | ICD-10-CM | POA: Diagnosis not present

## 2021-11-28 DIAGNOSIS — Z3483 Encounter for supervision of other normal pregnancy, third trimester: Secondary | ICD-10-CM

## 2021-12-04 ENCOUNTER — Encounter: Payer: Self-pay | Admitting: Obstetrics & Gynecology

## 2021-12-07 ENCOUNTER — Ambulatory Visit (INDEPENDENT_AMBULATORY_CARE_PROVIDER_SITE_OTHER): Payer: 59 | Admitting: Licensed Practical Nurse

## 2021-12-07 VITALS — BP 120/80 | Wt 308.0 lb

## 2021-12-07 DIAGNOSIS — O0990 Supervision of high risk pregnancy, unspecified, unspecified trimester: Secondary | ICD-10-CM

## 2021-12-07 DIAGNOSIS — Z3A30 30 weeks gestation of pregnancy: Secondary | ICD-10-CM

## 2021-12-07 NOTE — Progress Notes (Signed)
Routine Prenatal Care Visit  Subjective  Lori Chan is a 26 y.o. G1P0000 at [redacted]w[redacted]d being seen today for ongoing prenatal care.  She is currently monitored for the following issues for this high-risk pregnancy and has Trichomoniasis; Supervision of normal pregnancy; and Obesity affecting pregnancy on their problem list.  ----------------------------------------------------------------------------------- Patient reports  hip and pelvic pain when she wakes .  Has been walking and eating a balanced diet. Mood is good. Has not been contacted by [facility] to arrange future apts yet. Doing some reading about labor/birth, desires to go undmedicated.  Contractions: Not present. Vag. Bleeding: None.  Movement: Present. Leaking Fluid denies.  ----------------------------------------------------------------------------------- The following portions of the patient's history were reviewed and updated as appropriate: allergies, current medications, past family history, past medical history, past social history, past surgical history and problem list. Problem list updated.  Objective  Blood pressure 120/80, weight (!) 308 lb (139.7 kg), last menstrual period 05/07/2021. Pregravid weight 253 lb (114.8 kg) Total Weight Gain 55 lb (24.9 kg) Urinalysis: Urine Protein    Urine Glucose    Fetal Status: Fetal Heart Rate (bpm): 140 Fundal Height: 34 cm Movement: Present     General:  Alert, oriented and cooperative. Patient is in no acute distress.  Skin: Skin is warm and dry. No rash noted.   Cardiovascular: Normal heart rate noted  Respiratory: Normal respiratory effort, no problems with respiration noted  Abdomen: Soft, gravid, appropriate for gestational age. Pain/Pressure: Absent     Pelvic:  Cervical exam deferred        Extremities: Normal range of motion.     Mental Status: Normal mood and affect. Normal behavior. Normal judgment and thought content.   Assessment   26 y.o. G1P0000 at [redacted]w[redacted]d by  02/09/2022, by  Ultrasound presenting for routine prenatal visit  Plan   first Problems (from 07/05/21 to present)     Problem Noted Resolved   Obesity affecting pregnancy 07/20/2021 by Rod Can, CNM No   Supervision of normal pregnancy 07/05/2021 by Cleophas Dunker, [place] No   Overview Addendum 11/23/2021 11:43 AM by Chilton Greathouse, [place] Staff Provider  Office Location  [facility] Dating  [redacted]w[redacted]d on 3/30   Language  English Anatomy US    Flu Vaccine   Genetic Screen  NIPS:   TDaP vaccine    Hgb A1C or  GTT Early : early hgb a1c Third trimester : 128  Covid vaccianted   LAB RESULTS   Rhogam  NA Blood Type B/Positive/-- (05/19 1041)   Feeding Plan Formula Antibody Negative (05/19 1041)  Contraception  Rubella 2.76 (05/19 1041)  Circumcision N/A RPR Non Reactive (05/19 1041)   Pediatrician  [facility] HBsAg Negative (05/19 1041)   Support Person Family  HIV Non Reactive (05/19 1041)  Prenatal Classes  Varicella     GBS  (For PCN allergy, check sensitivities)   BTL Consent   GC/CT negative March '23  VBAC Consent NA Pap  negative 2021    Hgb Electro    Pelvis Tested NA CF      SMA         BMI >=40 [x ] early 1h gtt -  [ ]  screen sleep apnea [x ] anesthesia consult (early and late if BMI > 45) [x ] u/s for dating [ ]   [ ]  nutritional goals [ ]  folic acid 1mg  [ ]  bASA (>12 weeks) [ ]  consider nutrition consult [ ]  consider maternal EKG 1st trimester [ ]   Growth u/s 28 [ ] , 32 [ ] , 36 weeks [ ]  [ ]  NST/AFI weekly 34+ weeks (34[] ,35[] ,36[] , 37[] , 38[] , 39[] , 40[] ) [ ]  IOL by 41 weeks (scheduled, prn [] )           Preterm labor symptoms and general obstetric precautions including but not limited to vaginal bleeding, contractions, leaking of fluid and fetal movement were reviewed in detail with the patient. Please refer to After Visit Summary for other counseling recommendations.   Return in about 2 weeks (around 12/21/2021) for ROB.  messaged to arrange transfer to  [facility], CNM  , [facility]  12/07/21  9:26 AM

## 2021-12-14 ENCOUNTER — Other Ambulatory Visit: Payer: 59

## 2021-12-21 ENCOUNTER — Encounter: Payer: 59 | Admitting: Obstetrics & Gynecology

## 2021-12-31 ENCOUNTER — Encounter: Payer: Self-pay | Admitting: Obstetrics & Gynecology

## 2022-01-02 ENCOUNTER — Other Ambulatory Visit: Payer: 59

## 2022-01-09 ENCOUNTER — Other Ambulatory Visit: Payer: 59

## 2022-01-16 ENCOUNTER — Other Ambulatory Visit: Payer: 59

## 2022-01-20 ENCOUNTER — Encounter: Payer: Self-pay | Admitting: Obstetrics and Gynecology

## 2022-01-20 ENCOUNTER — Other Ambulatory Visit: Payer: Self-pay

## 2022-01-20 ENCOUNTER — Observation Stay
Admission: EM | Admit: 2022-01-20 | Discharge: 2022-01-20 | Disposition: A | Discharge status: Home / Self Care | Payer: 59 | Attending: Obstetrics | Admitting: Obstetrics

## 2022-01-20 DIAGNOSIS — O99283 Endocrine, nutritional and metabolic diseases complicating pregnancy, third trimester: Secondary | ICD-10-CM

## 2022-01-20 DIAGNOSIS — R609 Edema, unspecified: Secondary | ICD-10-CM | POA: Diagnosis not present

## 2022-01-20 DIAGNOSIS — E876 Hypokalemia: Secondary | ICD-10-CM | POA: Diagnosis not present

## 2022-01-20 DIAGNOSIS — O99891 Other specified diseases and conditions complicating pregnancy: Secondary | ICD-10-CM | POA: Diagnosis not present

## 2022-01-20 DIAGNOSIS — Z3A37 37 weeks gestation of pregnancy: Secondary | ICD-10-CM | POA: Diagnosis not present

## 2022-01-20 DIAGNOSIS — O368330 Maternal care for abnormalities of the fetal heart rate or rhythm, third trimester, not applicable or unspecified: Principal | ICD-10-CM | POA: Insufficient documentation

## 2022-01-20 DIAGNOSIS — Z3483 Encounter for supervision of other normal pregnancy, third trimester: Secondary | ICD-10-CM

## 2022-01-20 DIAGNOSIS — O99212 Obesity complicating pregnancy, second trimester: Secondary | ICD-10-CM

## 2022-01-20 LAB — COMPREHENSIVE METABOLIC PANEL
ALT: 16 U/L (ref 0–44)
AST: 21 U/L (ref 15–41)
Albumin: 2.8 g/dL — ABNORMAL LOW (ref 3.5–5.0)
Alkaline Phosphatase: 100 U/L (ref 38–126)
Anion gap: 9 (ref 5–15)
BUN: 6 mg/dL (ref 6–20)
CO2: 20 mmol/L — ABNORMAL LOW (ref 22–32)
Calcium: 9.3 mg/dL (ref 8.9–10.3)
Chloride: 108 mmol/L (ref 98–111)
Creatinine, Ser: 0.63 mg/dL (ref 0.44–1.00)
GFR, Estimated: >60 mL/min (ref >60)
Glucose, Bld: 128 mg/dL — ABNORMAL HIGH (ref 70–99)
Potassium: 3 mmol/L — ABNORMAL LOW (ref 3.5–5.1)
Sodium: 137 mmol/L (ref 135–145)
Total Bilirubin: 0.3 mg/dL (ref 0.3–1.2)
Total Protein: 6.9 g/dL (ref 6.5–8.1)

## 2022-01-20 LAB — SAMPLE TO BLOOD BANK

## 2022-01-20 MED ORDER — ACETAMINOPHEN 325 MG PO TABS: 650.0000 mg | ORAL_TABLET | ORAL | Status: DC | PRN

## 2022-01-20 MED ORDER — FUROSEMIDE 20 MG PO TABS
20.0000 mg | ORAL_TABLET | ORAL | Status: AC
  Administered 2022-01-20: 20 mg via ORAL
  Filled 2022-01-20: qty 1

## 2022-01-20 MED ORDER — POTASSIUM CHLORIDE CRYS ER 10 MEQ PO TBCR
40.0000 meq | EXTENDED_RELEASE_TABLET | Freq: Once | ORAL | Status: AC
  Administered 2022-01-20: 40 meq via ORAL
  Filled 2022-01-20: qty 4

## 2022-01-20 NOTE — OB Triage Note (Signed)
Patient discharged home per order.  She is stable and ambulatory. An After Visit Summary was printed and given to the patient. Discharge education completed with patient and support person including follow up instructions, appointments, and medication list. She received labor and bleeding precautions. Patient able to verbalize understanding. All questions fully answered upon discharge. Patient instructed to return to ED, call 911, or call provider for any changes in condition. Patient discharged home via personal vehicle with support person and all belongings.    

## 2022-01-20 NOTE — OB Triage Note (Signed)
Pt presents to triage with c/o swelling and HA. Pt states HA started 2 days ago when she stopped taking her baby aspirin. She has not taken anything for the headache pain.  Pt also reports b/l LE, UE, and facial edema that has increased over the past couple days.+FM. Denies VB, ctx. Pain: 8/10 headache.

## 2022-01-20 NOTE — OB Triage Note (Signed)
LABOR & DELIVERY OB TRIAGE NOTE  SUBJECTIVE  HPI Lori Chan is a 26 y.o. G1P0000 at [redacted]w[redacted]d who presents to Labor & Delivery for headache, increasing edema, and shortness of breath. She reports that for the past 2 weeks, the swelling in her legs, hand, and face has been increasing. She has gained 17 lbs in the past 2 weeks despite having a poor appetite and minimal intake. TWG for pregnancy 77 lbs. Her legs are tight and painful. She has been on her feet a lot every day. She has had an intermittent headache for the past 2 days. She denies visual changes and epigastric pain. She states that she does not take headache medicine and declines Tylenol. She states that she has to sleep sitting up d/t SOB. She had UPC and CBC at her prenatal visit at [facility] on 01/19/22, but they were unable to obtain a CMP.  She had a normal 1-hour glucose but A1C was elevated on 01/18/22. She has had proteinuria noted since 33 weeks.  OB History     Gravida  1   Para  0   Term  0   Preterm  0   AB  0   Living  0      SAB  0   IAB  0   Ectopic  0   Multiple  0   Live Births  0           Scheduled Meds:  furosemide  20 mg Oral NOW   potassium chloride  40 mEq Oral Once   Continuous Infusions: PRN Meds:.acetaminophen  OBJECTIVE  BP 115/66 (BP Location: Right Arm)   Pulse (!) 109   Temp 98.8 F (37.1 C) (Oral)   Resp 18   Ht 5\' 3"  (1.6 m)   Wt (!) 152.4 kg   LMP 05/07/2021 (Exact Date)   BMI 59.52 kg/m   General: Alert, cooperative, no acute distress. Mild facial edema Heart: RRR, no murmur Lungs: CTAB bilaterally, A&P, no dyspnea at rest/with conversation Abdomen: soft, gravid, non-tender Extremities: 2+ pitting edema in BLE, non-pitting edema in hands and forearms    NST I reviewed the NST and it was reactive.  Baseline: 130 Variability: moderate Accelerations: present Decelerations:none Toco: irregular Category 1  CMP     Component Value Date/Time   NA 137 01/20/2022  0419   K 3.0 (L) 01/20/2022 0419   CL 108 01/20/2022 0419   CO2 20 (L) 01/20/2022 0419   GLUCOSE 128 (H) 01/20/2022 0419   BUN 6 01/20/2022 0419   CREATININE 0.63 01/20/2022 0419   CALCIUM 9.3 01/20/2022 0419   PROT 6.9 01/20/2022 0419   ALBUMIN 2.8 (L) 01/20/2022 0419   AST 21 01/20/2022 0419   ALT 16 01/20/2022 0419   ALKPHOS 100 01/20/2022 0419   BILITOT 0.3 01/20/2022 0419   GFRNONAA >60 01/20/2022 0419   GFRAA >60 01/18/2017 0734     ASSESSMENT Impression  1) Pregnancy at G1P0000, [redacted]w[redacted]d, Estimated Date of Delivery: 02/09/22 2) Reassuring maternal/fetal status 3) Normotensive, preeclampsia labs reassuring 4) Edema 5) Hypokalemia  PLAN  1) Lasix 20 mg PO once and potassium 40 mEq now 2) Compression stockings 3) Reviewed comfort measures for edema. Encouraged to elevate feet, increase water and protein intake, minimize sodium, wear compression stockings. 4) Reviewed [facility] per anesthesia d/t potential for airway complications 5) D/c home with standard labor/return precautions. Reviewed s/s of preeclampsia.  Lloyd Huger, CNM  Lloyd Huger, CNM

## 2022-08-25 IMAGING — US US OB < 14 WKS - US OB TV - US DOPPLER
1 series · 14 of 28 positions shown · non-contrast
Comparison: None.

CLINICAL DATA: Vaginal bleeding



[Series 1: us ob less than 14 weeks w/ ob transvaginal and do · 14 of 72 slices shown]
[im 3/72]
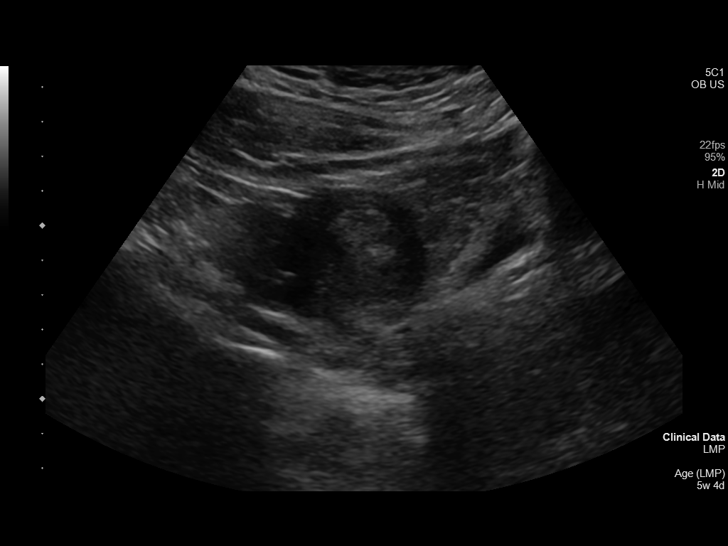
[im 8/72]
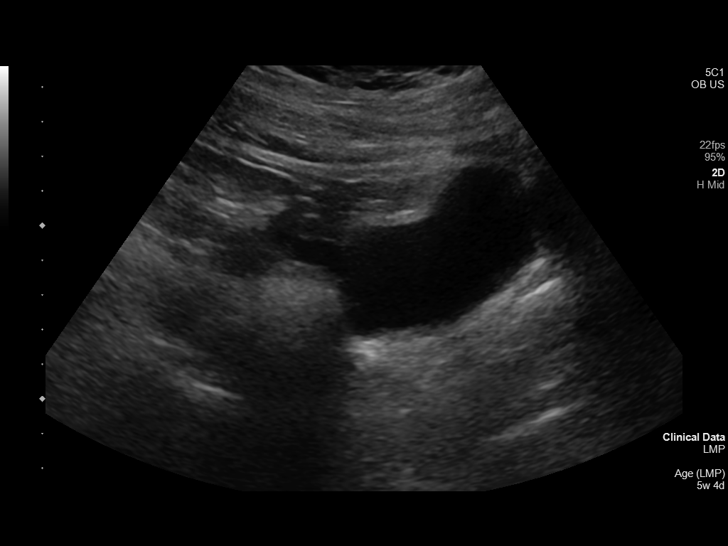
[im 14/72]
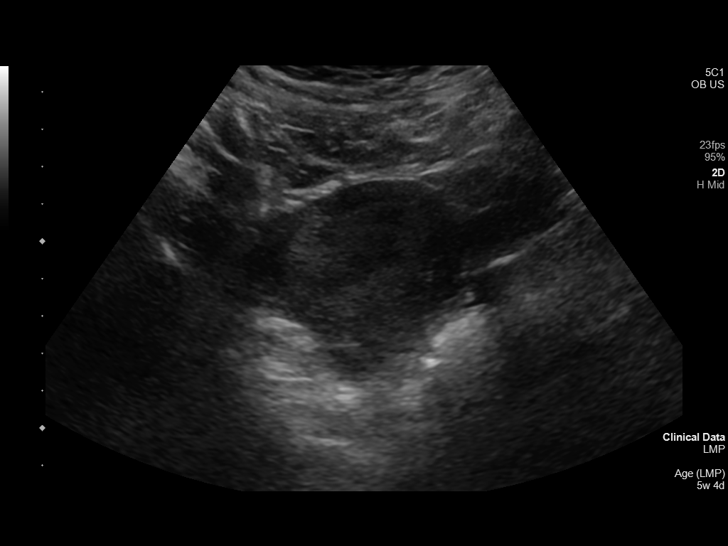
[im 19/72]
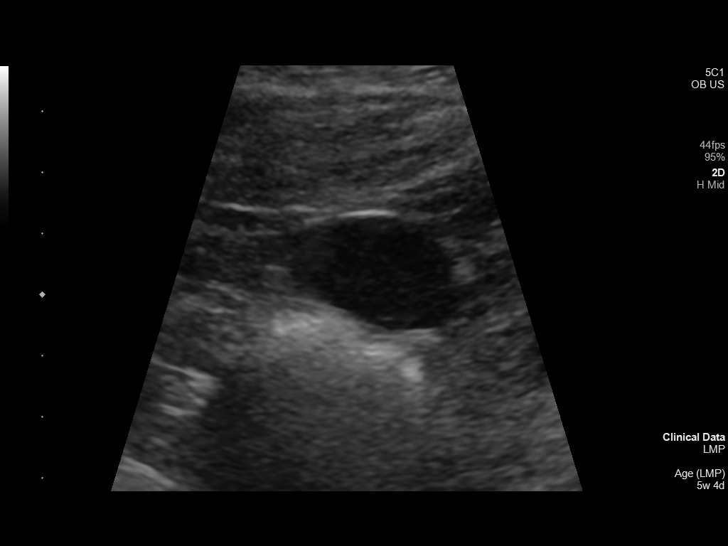
[im 24/72]
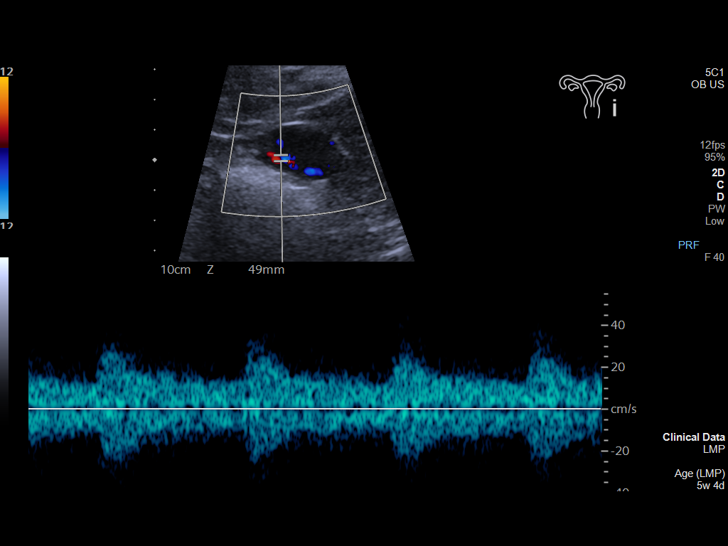
[im 29/72]
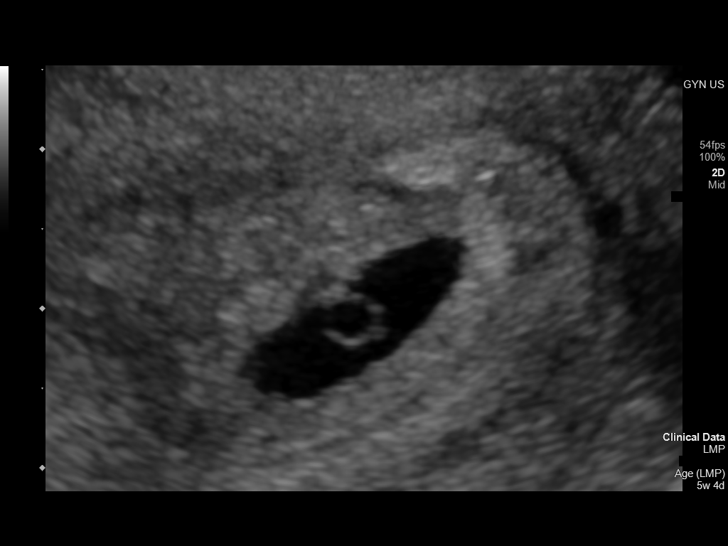
[im 35/72]
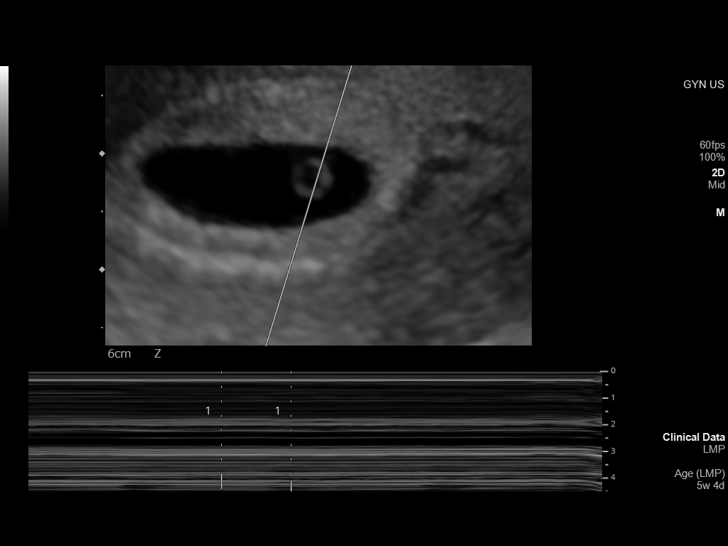
[im 40/72]
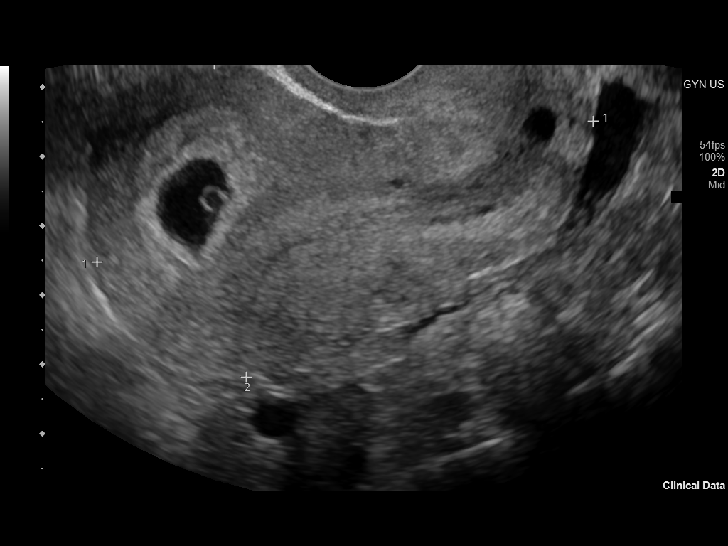
[im 45/72]
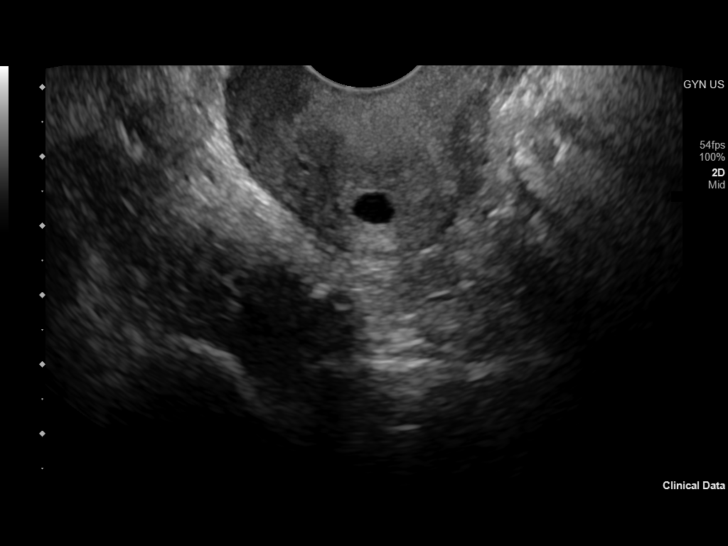
[im 50/72]
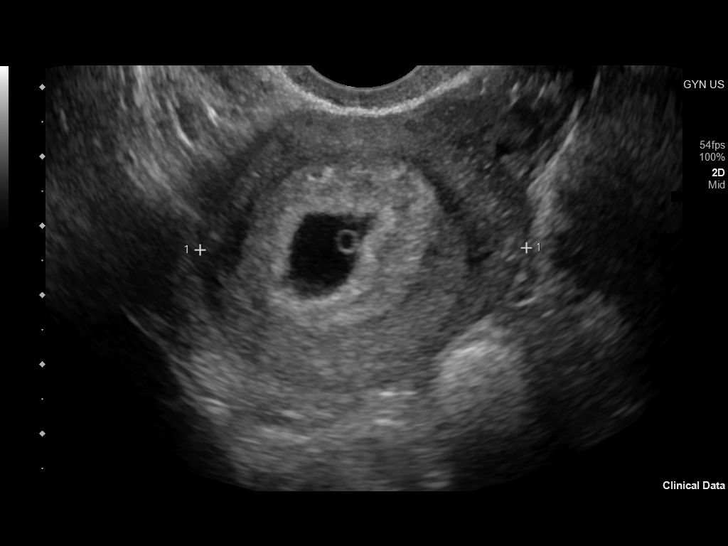
[im 56/72]
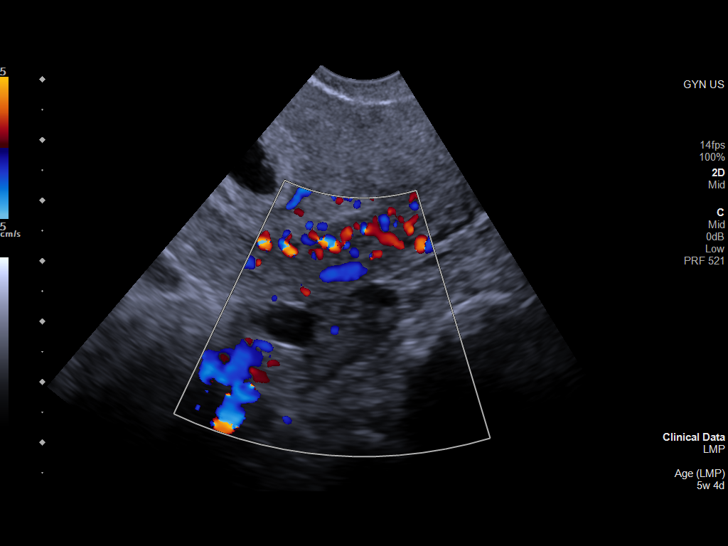
[im 61/72]
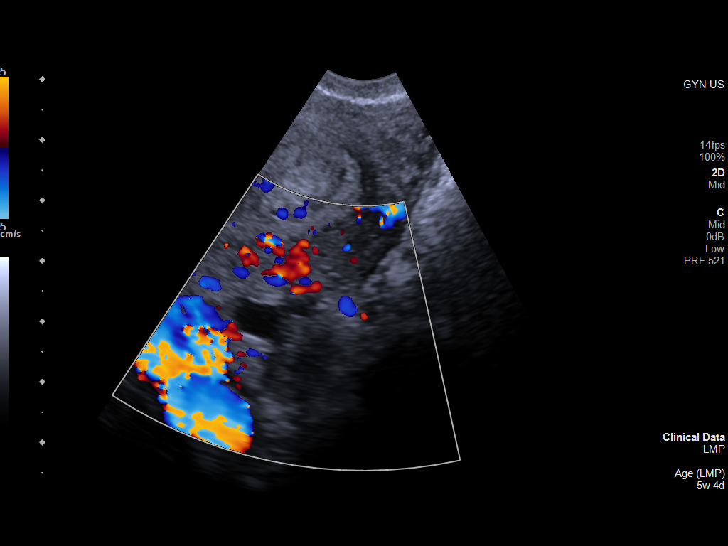
[im 66/72]
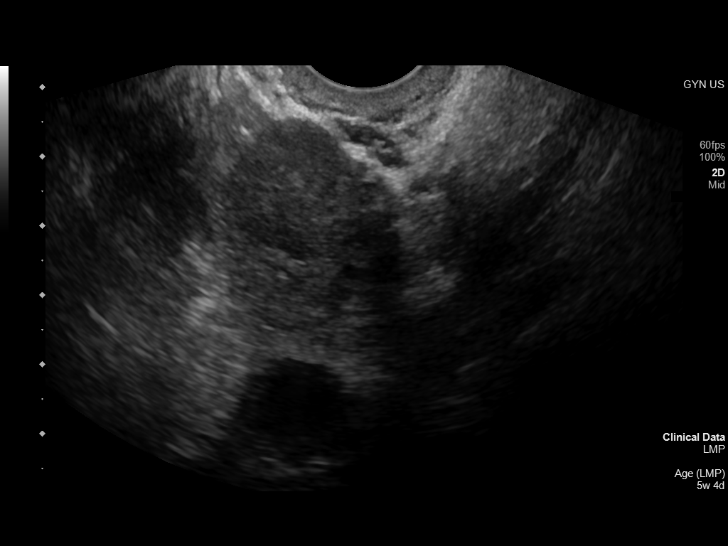
[im 72/72]
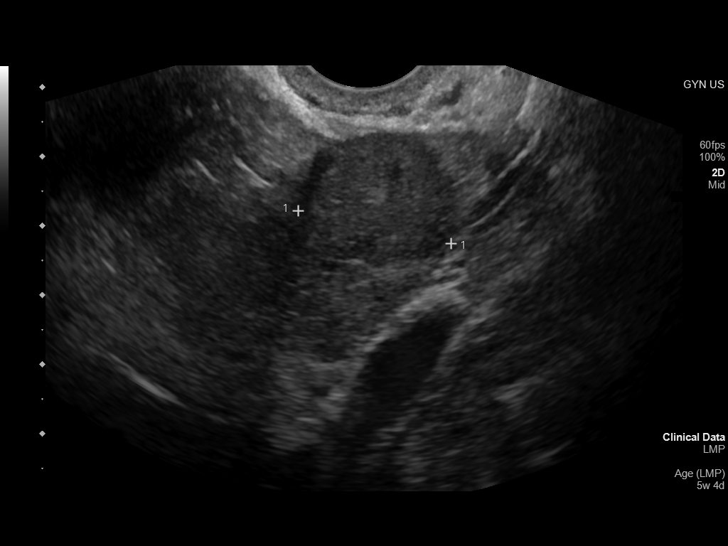

[14 of 28 positions shown; findings below may reference images not displayed]

FINDINGS: Intrauterine gestational sac: Single

Yolk sac:  Visualized.

Embryo:  Visualized.

Cardiac Activity: Visualized.

Heart Rate: 147 bpm

CRL:   3 mm   5 w 6 d                  US EDC: 02/09/2022

Subchorionic hemorrhage:  None visualized.

Maternal uterus/adnexae: Mildly complex likely corpus luteum of the
left ovary measuring 2 cm.

Pulsed Doppler evaluation of both ovaries demonstrates normal
appearing low-resistance arterial and venous waveforms.
IMPRESSION: Single live intrauterine pregnancy with estimated gestational age of
5 weeks 6 days. No acute abnormality visualized.

## 2023-04-05 ENCOUNTER — Ambulatory Visit: Payer: 59

## 2023-04-15 ENCOUNTER — Ambulatory Visit (LOCAL_COMMUNITY_HEALTH_CENTER): Payer: Self-pay

## 2023-04-15 DIAGNOSIS — Z111 Encounter for screening for respiratory tuberculosis: Secondary | ICD-10-CM

## 2023-04-16 ENCOUNTER — Ambulatory Visit (INDEPENDENT_AMBULATORY_CARE_PROVIDER_SITE_OTHER): Payer: 59 | Admitting: Pediatrics

## 2023-04-16 ENCOUNTER — Encounter: Payer: Self-pay | Admitting: Pediatrics

## 2023-04-16 VITALS — BP 114/75 | HR 90 | Temp 98.7°F | Ht 63.62 in | Wt 291.6 lb

## 2023-04-16 DIAGNOSIS — Z124 Encounter for screening for malignant neoplasm of cervix: Secondary | ICD-10-CM | POA: Diagnosis not present

## 2023-04-16 DIAGNOSIS — Z7689 Persons encountering health services in other specified circumstances: Secondary | ICD-10-CM | POA: Diagnosis not present

## 2023-04-16 DIAGNOSIS — Z1330 Encounter for screening examination for mental health and behavioral disorders, unspecified: Secondary | ICD-10-CM | POA: Diagnosis not present

## 2023-04-16 NOTE — Progress Notes (Signed)
Establish Care Note  BP 114/75 (BP Location: Right Arm, Patient Position: Sitting, Cuff Size: Large)   Pulse 90   Temp 98.7 F (37.1 C) (Oral)   Ht 5' 3.62" (1.616 m)   Wt 291 lb 9.6 oz (132.3 kg)   LMP 03/19/2023 (Exact Date)   SpO2 98%   BMI 50.65 kg/m    Subjective:    Patient ID: Lori Chan, female    DOB: 08-29-1995, 28 y.o.   MRN: [id number]  HPI: Lori Chan is a 28 y.o. female  Chief Complaint  Patient presents with   Establish Care    No acute concerns    Establishing care, the following was discussed today:  Discussed the use of AI scribe software for clinical note transcription with the patient, who gave verbal consent to proceed.  History of Present Illness   She presents for an annual physical exam to establish care.  She has not seen a primary care provider since becoming an adult and has no current health concerns. She does not take any medications.  Her last Pap smear was in 2023, around the time she had her daughter.  She has one daughter who is one year old and sends her to daycare during the week. She grew up in the area from around age six or seven and currently lives in the region.      No current outpatient medications on file prior to visit.   No current facility-administered medications on file prior to visit.    #HM Will review HM records and updated as needed. Cervical cancer screen: on initial chart review, last done 2021, would be due 2024. Will double check ahead of physical.  Relevant past medical, surgical, family and social history reviewed and updated as indicated. Interim medical history since our last visit reviewed. Allergies and medications reviewed and updated.  ROS per HPI unless specifically indicated above     Objective:    BP 114/75 (BP Location: Right Arm, Patient Position: Sitting, Cuff Size: Large)   Pulse 90   Temp 98.7 F (37.1 C) (Oral)   Ht 5' 3.62" (1.616 m)   Wt 291 lb 9.6 oz (132.3 kg)   LMP  03/19/2023 (Exact Date)   SpO2 98%   BMI 50.65 kg/m   Wt Readings from Last 3 Encounters:  04/16/23 291 lb 9.6 oz (132.3 kg)  01/20/22 (!) 336 lb (152.4 kg)  12/07/21 (!) 308 lb (139.7 kg)    Physical Exam Constitutional:      Appearance: Normal appearance.  HENT:     Head: Normocephalic and atraumatic.  Eyes:     Pupils: Pupils are equal, round, and reactive to light.  Cardiovascular:     Rate and Rhythm: Normal rate and regular rhythm.     Pulses: Normal pulses.     Heart sounds: Normal heart sounds.  Pulmonary:     Effort: Pulmonary effort is normal.     Breath sounds: Normal breath sounds.  Musculoskeletal:        General: Normal range of motion.     Cervical back: Normal range of motion.  Skin:    General: Skin is warm and dry.     Capillary Refill: Capillary refill takes less than 2 seconds.  Neurological:     General: No focal deficit present.     Mental Status: She is alert. Mental status is at baseline.  Psychiatric:        Mood and Affect: Mood normal.  Behavior: Behavior normal.        04/16/2023    3:01 PM  Depression screen PHQ 2/9  Decreased Interest 0  Down, Depressed, Hopeless 0  PHQ - 2 Score 0  Altered sleeping 0  Tired, decreased energy 0  Change in appetite 0  Feeling bad or failure about yourself  0  Trouble concentrating 0  Moving slowly or fidgety/restless 0  Suicidal thoughts 0  PHQ-9 Score 0        04/16/2023    3:01 PM  GAD 7 : Generalized Anxiety Score  Nervous, Anxious, on Edge 0  Control/stop worrying 0  Worry too much - different things 0  Trouble relaxing 0  Restless 0  Easily annoyed or irritable 0  Afraid - awful might happen 0  Total GAD 7 Score 0       Assessment & Plan:  Assessment & Plan   Encounter to establish care Reviewed available patient record including history, medications, problem list. HM updated as able. Will review and/or request outside records (if applicable) and will fill remaining HM gaps  as needed at follow up visit.  Encounter for behavioral health screening As part of their intake evaluation, the patient was screened for depression, anxiety.  PHQ9 SCORE 0, GAD7 SCORE 0. Screening results negative for tested conditions. CTM.  Screening for cervical cancer Last Pap smear possibly in 2023. -Review records to confirm date of last Pap smear. -If due, schedule Pap smear during upcoming physical exam.  Follow up plan: Return in about 1 week (around 04/23/2023) for Physical w pap.  Jawon Dipiero Howell Pringle, MD

## 2023-04-16 NOTE — Patient Instructions (Signed)
Good to meet you! Welcome to [facility]!  As your primary care doctor, I look forward to working with you to help you reach your health goals.  Please be aware of a couple of logistical items: - If you message me on mychart, it may take me 1-2 business days to get back to you. This is for non-urgent messaging.  - If you require urgent clinical attention, please call the clinic or present to urgent care/emergency room - If you have labs, I typically will send a message about them in 1-2 business days. - I am not here on Mondays, otherwise will be available from Tuesday-Friday during 8a-5pm.

## 2023-04-18 ENCOUNTER — Ambulatory Visit (LOCAL_COMMUNITY_HEALTH_CENTER): Payer: 59

## 2023-04-18 DIAGNOSIS — Z111 Encounter for screening for respiratory tuberculosis: Secondary | ICD-10-CM

## 2023-04-18 LAB — TB SKIN TEST
Induration: 0 mm
TB Skin Test: NEGATIVE

## 2023-04-18 NOTE — Addendum Note (Signed)
Addended by: Abagail Kitchens on: 04/18/2023 01:33 PM   Modules accepted: Orders

## 2023-04-23 ENCOUNTER — Encounter: Payer: 59 | Admitting: Pediatrics

## 2023-04-25 ENCOUNTER — Other Ambulatory Visit (HOSPITAL_COMMUNITY)
Admission: RE | Admit: 2023-04-25 | Discharge: 2023-04-25 | Disposition: A | Discharge status: Home / Self Care | Payer: 59 | Source: Ambulatory Visit | Attending: Pediatrics | Admitting: Pediatrics

## 2023-04-25 ENCOUNTER — Ambulatory Visit (INDEPENDENT_AMBULATORY_CARE_PROVIDER_SITE_OTHER): Payer: 59 | Admitting: Pediatrics

## 2023-04-25 VITALS — BP 117/84 | HR 85 | Temp 97.7°F | Resp 15 | Ht 63.62 in | Wt 292.2 lb

## 2023-04-25 DIAGNOSIS — Z124 Encounter for screening for malignant neoplasm of cervix: Secondary | ICD-10-CM | POA: Insufficient documentation

## 2023-04-25 DIAGNOSIS — Z0000 Encounter for general adult medical examination without abnormal findings: Secondary | ICD-10-CM | POA: Diagnosis not present

## 2023-04-25 DIAGNOSIS — Z131 Encounter for screening for diabetes mellitus: Secondary | ICD-10-CM | POA: Diagnosis not present

## 2023-04-25 DIAGNOSIS — D649 Anemia, unspecified: Secondary | ICD-10-CM

## 2023-04-25 DIAGNOSIS — Z1330 Encounter for screening examination for mental health and behavioral disorders, unspecified: Secondary | ICD-10-CM

## 2023-04-25 DIAGNOSIS — Z13220 Encounter for screening for lipoid disorders: Secondary | ICD-10-CM | POA: Diagnosis not present

## 2023-04-25 NOTE — Patient Instructions (Signed)
 [facility] Specialty Address [facility] Phone Number Practice Name  [place] [facility]ist [address] [place] [phone] Alric Quan CRISP DDS MS PA  [place] [facility]ist [address] [place] [phone] Enid Derry DDS PA III  [place] [facility] Dentist [address] [place] [phone] [facility]  [place] [facility] Dentist [address] [place] [phone] [facility]  [place] [facility]ist [address] [place] [phone] Sheral Apley IV DMD PA  [place] [facility] Dentist [address] [place] [phone] [facility] FAMILY DENTISTRY  [place] General Dentist [address] [place] [phone] [facility] General Dentist [address] [place] [phone] Yevette Edwards  [place] [facility] Dentist [address] [place] [phone] [facility] GOVT  [place] [address] [place] [phone] [facility] Oral Surgeon [address] Louellen Molder [phone] Charna Archer DMD MS [facility]  [place] [facility] Dentist [address] [place] [phone] [facility] PEDIATRIC DENTISTRY

## 2023-04-25 NOTE — Progress Notes (Signed)
 BP 117/84 (BP Location: Left Arm, Patient Position: Sitting, Cuff Size: Large)   Pulse 85   Temp 97.7 F (36.5 C) (Oral)   Resp 15   Ht 5' 3.62 (1.616 m)   Wt 292 lb 3.2 oz (132.5 kg)   LMP 04/19/2023 (Exact Date)   SpO2 99%   BMI 50.75 kg/m    Annual Physical Exam - Female  Subjective:   CC: Annual Exam (With PAP. )   Lori Chan is a 28 y.o. female patient here for a preventative health maintenance exam and has no acute complaints.  Health Habits: DIET: not asked, discussed med eating style EXERCISE: discussed 10 min workouts  DENTAL EXAM: Due EYE EXAM: Due                       Relevant Gynecologic History LMP: Patient's last menstrual period was 04/19/2023 (exact date).  Menstrual Status: premenopausal, Flow regular every month without intermenstrual spotting PAP History:  Result Date Procedure Results Follow-ups  08/26/2019 Cytology - PAP Neisseria Gonorrhea: Negative Chlamydia: Negative Adequacy: Satisfactory for evaluation; transformation zone component PRESENT. Diagnosis: - Negative for intraepithelial lesion or malignancy (NILM) Microorganisms: Fungal organisms present consistent with Candida spp. Comment: Normal Reference Ranger Chlamydia - Negative Comment: Normal Reference Range Neisseria Gonorrhea - Negative     History abnormal PAP: No  Sexual activity: not sexually active  Family history breast, ovarian cancer: No Domestic Violence Screen, feels safe at home: Yes, borther, father, daughter, 1 year  family history includes Congestive Heart Failure in her father; Diabetes in her maternal grandmother and mother.  Social History   Tobacco Use   Smoking status: Former    Current packs/day: 0.00    Types: Cigarettes    Quit date: 06/04/2021    Years since quitting: 1.8   Smokeless tobacco: Never  Vaping Use   Vaping status: Never Used  Substance Use Topics   Alcohol use: No   Drug use: Yes    Types: Marijuana    Comment: everyday   Social  History   Social History Narrative   Not on file    Social drivers questionnaire is reviewed and is positive for: none  Depression Screening:     04/25/2023    1:06 PM 04/16/2023    3:01 PM  Depression screen PHQ 2/9  Decreased Interest 0 0  Down, Depressed, Hopeless  0  PHQ - 2 Score 0 0  Altered sleeping 0 0  Tired, decreased energy 0 0  Change in appetite 0 0  Feeling bad or failure about yourself  0 0  Trouble concentrating 0 0  Moving slowly or fidgety/restless 0 0  Suicidal thoughts 0 0  PHQ-9 Score 0 0  Difficult doing work/chores Not difficult at all        04/25/2023    1:06 PM 04/16/2023    3:01 PM  GAD 7 : Generalized Anxiety Score  Nervous, Anxious, on Edge 0 0  Control/stop worrying 0 0  Worry too much - different things 0 0  Trouble relaxing 0 0  Restless 0 0  Easily annoyed or irritable 0 0  Afraid - awful might happen 0 0  Total GAD 7 Score 0 0  Anxiety Difficulty Not difficult at all     Mental Health Plan: CTM  Self Management Goals  Goals   None     Health Maintenance Colon Cancer Screening : Not applicable Mammogram : Not applicable DXA scan : Not applicable  Immunizations : declines flu and covid  Review of Systems See HPI for relevant ROS.  No outpatient medications prior to visit.   No facility-administered medications prior to visit.     Patient Active Problem List   Diagnosis Date Noted   Obesity affecting pregnancy 07/20/2021   Supervision of normal pregnancy 07/05/2021   Trichomoniasis 11/13/2020    Objective:   Vitals:   04/25/23 1307  BP: 117/84  Pulse: 85  Temp: 97.7 F (36.5 C)  Resp: 15  Height: 5' 3.62 (1.616 m)  Weight: 292 lb 3.2 oz (132.5 kg)  SpO2: 99%  TempSrc: Oral  BMI (Calculated): 50.75    Body mass index is 50.75 kg/m.  Physical Exam Exam conducted with a chaperone present.  Constitutional:      Appearance: Normal appearance.  HENT:     Head: Normocephalic and atraumatic.  Eyes:      Pupils: Pupils are equal, round, and reactive to light.  Cardiovascular:     Rate and Rhythm: Normal rate and regular rhythm.     Pulses: Normal pulses.     Heart sounds: Normal heart sounds.  Pulmonary:     Effort: Pulmonary effort is normal.     Breath sounds: Normal breath sounds.  Abdominal:     General: Abdomen is flat.     Palpations: Abdomen is soft.  Genitourinary:    Exam position: Lithotomy position.     Labia:        Right: No rash or tenderness.        Left: No rash or tenderness.      Vagina: Normal.     Cervix: Normal.  Musculoskeletal:        General: Normal range of motion.     Cervical back: Normal range of motion.  Skin:    General: Skin is warm and dry.     Capillary Refill: Capillary refill takes less than 2 seconds.  Neurological:     General: No focal deficit present.     Mental Status: She is alert. Mental status is at baseline.  Psychiatric:        Mood and Affect: Mood normal.        Behavior: Behavior normal.    Assessment and Plan:   Annual physical exam Discussed lifestyle modifications and goals including plant based eating styles (such as: Mediterranean eating style), regular exercise (at least 150 min of moderate-intensity aerobic exercise per week, given AHA workout handout), get adequate sleep, and continue working with PCP towards meeting health goals to ensure healthy aging.  -     CBC -     Comprehensive metabolic panel  Encounter for behavioral health screening As part of their intake evaluation, the patient was screened for depression, anxiety.  PHQ9 SCORE 0, GAD7 SCORE 0. Screening results negative for tested conditions. CTM.   Screening for cervical cancer -     Cytology - PAP  Diabetes mellitus screening -     Hemoglobin A1c  Lipid screening -     Lipid panel    This plan was discussed with the patient and questions were answered. There were no further concerns.  Follow up as indicated, or sooner should any new  problems arise, if conditions worsen, or if they are otherwise concerned.   See patient instructions for additional information.  Shuree Brossart SHAUNNA NETT, MD  Family Medicine      No future appointments.

## 2023-04-26 LAB — CBC
Hematocrit: 34.8 % (ref 34.0–46.6)
Hemoglobin: 10.9 g/dL — ABNORMAL LOW (ref 11.1–15.9)
MCH: 24.5 pg — ABNORMAL LOW (ref 26.6–33.0)
MCHC: 31.3 g/dL — ABNORMAL LOW (ref 31.5–35.7)
MCV: 78 fL — ABNORMAL LOW (ref 79–97)
Platelets: 489 10*3/uL — ABNORMAL HIGH (ref 150–450)
RBC: 4.44 x10E6/uL (ref 3.77–5.28)
RDW: 14.4 % (ref 11.7–15.4)
WBC: 10.3 10*3/uL (ref 3.4–10.8)

## 2023-04-26 LAB — COMPREHENSIVE METABOLIC PANEL
ALT: 10 [IU]/L (ref 0–32)
AST: 15 [IU]/L (ref 0–40)
Albumin: 4.1 g/dL (ref 4.0–5.0)
Alkaline Phosphatase: 120 [IU]/L (ref 44–121)
BUN/Creatinine Ratio: 7 — ABNORMAL LOW (ref 9–23)
BUN: 5 mg/dL — ABNORMAL LOW (ref 6–20)
Bilirubin Total: <0.2 mg/dL (ref 0.0–1.2)
CO2: 25 mmol/L (ref 20–29)
Calcium: 9.4 mg/dL (ref 8.7–10.2)
Chloride: 103 mmol/L (ref 96–106)
Creatinine, Ser: 0.75 mg/dL (ref 0.57–1.00)
Globulin, Total: 3.4 g/dL (ref 1.5–4.5)
Glucose: 93 mg/dL (ref 70–99)
Potassium: 3.9 mmol/L (ref 3.5–5.2)
Sodium: 141 mmol/L (ref 134–144)
Total Protein: 7.5 g/dL (ref 6.0–8.5)
eGFR: 112 mL/min/{1.73_m2} (ref >59)

## 2023-04-26 LAB — LIPID PANEL
Chol/HDL Ratio: 3.2 {ratio} (ref 0.0–4.4)
Cholesterol, Total: 158 mg/dL (ref 100–199)
HDL: 50 mg/dL (ref >39)
LDL Chol Calc (NIH): 95 mg/dL (ref 0–99)
Triglycerides: 66 mg/dL (ref 0–149)
VLDL Cholesterol Cal: 13 mg/dL (ref 5–40)

## 2023-04-26 LAB — HEMOGLOBIN A1C
Est. average glucose Bld gHb Est-mCnc: 128 mg/dL
Hgb A1c MFr Bld: 6.1 % — ABNORMAL HIGH (ref 4.8–5.6)

## 2023-04-30 ENCOUNTER — Other Ambulatory Visit: Payer: Self-pay | Admitting: Pediatrics

## 2023-04-30 DIAGNOSIS — D649 Anemia, unspecified: Secondary | ICD-10-CM

## 2023-05-02 LAB — IRON,TIBC AND FERRITIN PANEL
Ferritin: 28 ng/mL (ref 15–150)
Iron Saturation: 7 % — CL (ref 15–55)
Iron: 25 ug/dL — ABNORMAL LOW (ref 27–159)
Total Iron Binding Capacity: 379 ug/dL (ref 250–450)
UIBC: 354 ug/dL (ref 131–425)

## 2023-05-02 LAB — SPECIMEN STATUS REPORT

## 2023-05-03 LAB — CYTOLOGY - PAP: Diagnosis: NEGATIVE

## 2023-05-06 ENCOUNTER — Other Ambulatory Visit: Payer: Self-pay | Admitting: Pediatrics

## 2023-05-06 DIAGNOSIS — D509 Iron deficiency anemia, unspecified: Secondary | ICD-10-CM

## 2023-05-06 NOTE — Progress Notes (Signed)
 Placed hematology referral order.  Axle Parfait Howell Pringle, MD

## 2023-05-22 ENCOUNTER — Ambulatory Visit: Payer: Self-pay | Admitting: Pediatrics

## 2023-05-22 ENCOUNTER — Encounter: Payer: Self-pay | Admitting: Pediatrics

## 2023-05-22 NOTE — Telephone Encounter (Signed)
 Appointment has been made

## 2023-06-03 DIAGNOSIS — H6691 Otitis media, unspecified, right ear: Secondary | ICD-10-CM | POA: Diagnosis not present

## 2023-06-04 ENCOUNTER — Ambulatory Visit: Admitting: Family Medicine

## 2023-06-10 ENCOUNTER — Ambulatory Visit: Payer: Self-pay | Admitting: Pediatrics

## 2023-06-10 NOTE — Telephone Encounter (Signed)
 Chief Complaint: old c/s site draining  Symptoms: clear drainage and painful at times   Disposition: [] ED /[] Urgent Care (no appt availability in office) / [x] Appointment(In office/virtual)/ []  [facility]/ [] Home Care/ [] Refused Recommended Disposition /[] [facility]/ []  Follow-up with PCP Additional Notes: Pt called with [age] year old c/s site draining clear fluid. Pt noticed this a week ago. Top of underwear "was soaked."  Pt states it can be painful at times depending on certain movement. Pt is washing it with antibacterial soap throughout the day. Pt denies bleeding, fever. PT stated "its kinda red on the left side." Pt has appt 3/25 @ 1420. RN gave care advice and pt verbalized understanding.             Copied from CRM [id number]. Topic: Clinical - Red Word Triage >> Jun 10, 2023  2:31 PM Elle L wrote: Red Word that prompted transfer to Nurse Triage: The patient's [age] year old c-section scar is red, painful, and has been having clear drainage. Answer Assessment - Initial Assessment Questions 1. SYMPTOM: "What's the main symptom you're concerned about?" (e.g., drainage, incision opening up, pain, redness)     Clear draining  2. ONSET: "When did   start?"     A week ago  3. DATE of C-SECTION: "When was the C-section performed?"      Nearly 2 years  4. REDNESS: "Is there any redness at the incision site?" If Yes, ask: "How wide across is the redness?" (inches, centimeters)      Redness on left side  5. PAIN: "Is there any pain?" If Yes, ask: "How bad is it?"  (Scale 1-10; or mild, moderate, severe)   - NONE (0): no pain   - MILD (1-3): doesn't interfere with normal activities    - MODERATE (4-7): interferes with normal activities or awakens from sleep    - SEVERE (8-10): excruciating pain, unable to do any normal activities     Mild  6. BLEEDING: "Is there any bleeding?" If Yes, ask: "How much?" and "Where?" (e.g., incision site, vaginal)      No blood  7.  DRAINAGE: "Is there any drainage from the incision site?" If Yes, ask: "What color and how much?" (e.g., red, cloudy, pus; drops, teaspoon)     Yes, clear.  8. FEVER: "Do you have a fever?" If Yes, ask: "What is your temperature, how was it measured, and when did it start?"     Denies  9. OTHER SYMPTOMS: "Do you have any other symptoms?" (e.g., rash elsewhere on body, shaking chills, weakness)     Denies  Protocols used: Postpartum - C-section Incision Symptoms-A-AH  Reason for Disposition  [1] Small red area or streak AND [2] no fever  Answer Assessment - Initial Assessment Questions 1. SYMPTOM: "What's the main symptom you're concerned about?" (e.g., drainage, incision opening up, pain, redness)     Clear draining  2. ONSET: "When did   start?"     A week ago  3. DATE of C-SECTION: "When was the C-section performed?"      Nearly 2 years  4. REDNESS: "Is there any redness at the incision site?" If Yes, ask: "How wide across is the redness?" (inches, centimeters)      Redness on left side  5. PAIN: "Is there any pain?" If Yes, ask: "How bad is it?"  (Scale 1-10; or mild, moderate, severe)   - NONE (0): no pain   - MILD (1-3): doesn't interfere with  normal activities    - MODERATE (4-7): interferes with normal activities or awakens from sleep    - SEVERE (8-10): excruciating pain, unable to do any normal activities     Mild  6. BLEEDING: "Is there any bleeding?" If Yes, ask: "How much?" and "Where?" (e.g., incision site, vaginal)      No blood  7. DRAINAGE: "Is there any drainage from the incision site?" If Yes, ask: "What color and how much?" (e.g., red, cloudy, pus; drops, teaspoon)     Yes, clear.  8. FEVER: "Do you have a fever?" If Yes, ask: "What is your temperature, how was it measured, and when did it start?"     Denies  9. OTHER SYMPTOMS: "Do you have any other symptoms?" (e.g., rash elsewhere on body, shaking chills, weakness)     Denies  Protocols used: Postpartum -  C-section Incision Symptoms-A-AH

## 2023-06-11 ENCOUNTER — Ambulatory Visit (INDEPENDENT_AMBULATORY_CARE_PROVIDER_SITE_OTHER): Admitting: Pediatrics

## 2023-06-11 ENCOUNTER — Encounter: Payer: Self-pay | Admitting: Pediatrics

## 2023-06-11 VITALS — BP 124/77 | HR 78 | Temp 98.4°F | Wt 290.4 lb

## 2023-06-11 DIAGNOSIS — D509 Iron deficiency anemia, unspecified: Secondary | ICD-10-CM | POA: Diagnosis not present

## 2023-06-11 DIAGNOSIS — L309 Dermatitis, unspecified: Secondary | ICD-10-CM | POA: Diagnosis not present

## 2023-06-11 DIAGNOSIS — Z1330 Encounter for screening examination for mental health and behavioral disorders, unspecified: Secondary | ICD-10-CM | POA: Diagnosis not present

## 2023-06-11 NOTE — Progress Notes (Unsigned)
 Office Visit  BP 124/77   Pulse 78   Temp 98.4 F (36.9 C) (Oral)   Wt 290 lb 6.4 oz (131.7 kg)   LMP 05/17/2023   SpO2 98%   BMI 50.44 kg/m    Subjective:    Patient ID: Lori Chan, female    DOB: [birth year], 28 y.o.   MRN: [id number]  HPI: Lori Chan is a 28 y.o. female  Chief Complaint  Patient presents with   Drainage from Incision    Pt states she has noticed draining from her c/s scar started about 2 weeks ago    Discussed the use of AI scribe software for clinical note transcription with the patient, who gave verbal consent to proceed.  History of Present Illness   Lori Chan is a 28 year old female who presents with clear drainage from her C-section scar.  Two weeks ago, she noticed clear drainage from her C-section scar. She has been using antibacterial soap to maintain cleanliness. The area is sometimes painful to touch, particularly in a specific spot, but she has not observed any further drainage recently.  She has been prescribed iron supplementation but forgets to take it regularly. She experiences fatigue and decreased stamina, likely related to her iron deficiency.      Relevant past medical, surgical, family and social history reviewed and updated as indicated. Interim medical history since our last visit reviewed. Allergies and medications reviewed and updated.  ROS per HPI unless specifically indicated above     Objective:    BP 124/77   Pulse 78   Temp 98.4 F (36.9 C) (Oral)   Wt 290 lb 6.4 oz (131.7 kg)   LMP 05/17/2023   SpO2 98%   BMI 50.44 kg/m   Wt Readings from Last 3 Encounters:  06/11/23 290 lb 6.4 oz (131.7 kg)  04/25/23 292 lb 3.2 oz (132.5 kg)  04/16/23 291 lb 9.6 oz (132.3 kg)     Physical Exam Constitutional:      Appearance: Normal appearance.  Pulmonary:     Effort: Pulmonary effort is normal.  Abdominal:     Comments: Some skin breaktodown/ redness near c section scar no pus or opening   Musculoskeletal:        General: Normal range of motion.  Skin:    Comments: Normal skin color  Neurological:     General: No focal deficit present.     Mental Status: She is alert. Mental status is at baseline.  Psychiatric:        Mood and Affect: Mood normal.        Behavior: Behavior normal.        Thought Content: Thought content normal.         06/11/2023    2:25 PM 04/25/2023    1:06 PM 04/16/2023    3:01 PM  Depression screen PHQ 2/9  Decreased Interest 0 0 0  Down, Depressed, Hopeless 0  0  PHQ - 2 Score 0 0 0  Altered sleeping 0 0 0  Tired, decreased energy 0 0 0  Change in appetite 0 0 0  Feeling bad or failure about yourself  0 0 0  Trouble concentrating 0 0 0  Moving slowly or fidgety/restless 0 0 0  Suicidal thoughts 0 0 0  PHQ-9 Score 0 0 0  Difficult doing work/chores Not difficult at all Not difficult at all        06/11/2023    2:26 PM 04/25/2023  1:06 PM 04/16/2023    3:01 PM  GAD 7 : Generalized Anxiety Score  Nervous, Anxious, on Edge 0 0 0  Control/stop worrying 0 0 0  Worry too much - different things 0 0 0  Trouble relaxing 0 0 0  Restless 0 0 0  Easily annoyed or irritable 0 0 0  Afraid - awful might happen 0 0 0  Total GAD 7 Score 0 0 0  Anxiety Difficulty Not difficult at all Not difficult at all        Assessment & Plan:  Assessment & Plan   Dermatitis Slight redness due to skin breakdown, no infection, no current drainage. May be contact dematitis vs fungal given present in skin fold. Encouraged keeping area dry and can try OTC topical abx. If not working, consider OTC hydrocortisone. If that doesn't work consider antifungal cream. - Apply OTC triple antibiotic ointment after washing. - Contact via MyChart if no improvement; consider antifungal cream if needed.  Iron deficiency anemia, unspecified iron deficiency anemia type Assessment & Plan: Fatigue and low stamina due to iron deficiency anemia. Considering iron infusions due to  non-compliance with oral supplementation but limited by insurance coverage. She is looking to switch insurances so will revisit. - Explore iron infusion options.   Encounter for behavioral health screening As part of their intake evaluation, the patient was screened for depression, anxiety.  PHQ9 SCORE 0, GAD7 SCORE 0. Screening results negative for tested conditions. See plan under problem/diagnosis above.   Follow up plan: Return if symptoms worsen or fail to improve.  Jackolyn Confer, MD

## 2023-06-11 NOTE — Patient Instructions (Signed)
 If ointment doesn't work, try over the counter hydrocortisone cream

## 2023-06-12 ENCOUNTER — Encounter: Payer: Self-pay | Admitting: Pediatrics

## 2023-06-12 DIAGNOSIS — R7303 Prediabetes: Secondary | ICD-10-CM | POA: Insufficient documentation

## 2023-06-12 DIAGNOSIS — D509 Iron deficiency anemia, unspecified: Secondary | ICD-10-CM | POA: Insufficient documentation

## 2023-06-12 NOTE — Assessment & Plan Note (Signed)
 Fatigue and low stamina due to iron deficiency anemia. Considering iron infusions due to non-compliance with oral supplementation but limited by insurance coverage. She is looking to switch insurances so will revisit. - Explore iron infusion options.

## 2023-08-22 ENCOUNTER — Ambulatory Visit: Admitting: Pediatrics

## 2024-01-14 ENCOUNTER — Emergency Department
Admission: EM | Admit: 2024-01-14 | Discharge: 2024-01-14 | Disposition: A | Discharge status: Home / Self Care | Attending: Emergency Medicine | Admitting: Emergency Medicine

## 2024-01-14 ENCOUNTER — Emergency Department

## 2024-01-14 DIAGNOSIS — L03314 Cellulitis of groin: Secondary | ICD-10-CM | POA: Diagnosis not present

## 2024-01-14 DIAGNOSIS — L304 Erythema intertrigo: Secondary | ICD-10-CM | POA: Diagnosis not present

## 2024-01-14 DIAGNOSIS — R21 Rash and other nonspecific skin eruption: Secondary | ICD-10-CM | POA: Diagnosis present

## 2024-01-14 LAB — CBC WITH DIFFERENTIAL/PLATELET
Abs Immature Granulocytes: 0.04 K/uL (ref 0.00–0.07)
Basophils Absolute: 0 K/uL (ref 0.0–0.1)
Basophils Relative: 0 %
Eosinophils Absolute: 0.1 K/uL (ref 0.0–0.5)
Eosinophils Relative: 1 %
HCT: 34 % — ABNORMAL LOW (ref 36.0–46.0)
Hemoglobin: 10.2 g/dL — ABNORMAL LOW (ref 12.0–15.0)
Immature Granulocytes: 0 %
Lymphocytes Relative: 31 %
Lymphs Abs: 3.1 K/uL (ref 0.7–4.0)
MCH: 23.2 pg — ABNORMAL LOW (ref 26.0–34.0)
MCHC: 30 g/dL (ref 30.0–36.0)
MCV: 77.3 fL — ABNORMAL LOW (ref 80.0–100.0)
Monocytes Absolute: 0.5 K/uL (ref 0.1–1.0)
Monocytes Relative: 5 %
Neutro Abs: 6.1 K/uL (ref 1.7–7.7)
Neutrophils Relative %: 63 %
Platelets: 438 K/uL — ABNORMAL HIGH (ref 150–400)
RBC: 4.4 MIL/uL (ref 3.87–5.11)
RDW: 16 % — ABNORMAL HIGH (ref 11.5–15.5)
WBC: 9.9 K/uL (ref 4.0–10.5)
nRBC: 0 % (ref 0.0–0.2)

## 2024-01-14 LAB — BASIC METABOLIC PANEL WITH GFR
Anion gap: 10 (ref 5–15)
BUN: 11 mg/dL (ref 6–20)
CO2: 24 mmol/L (ref 22–32)
Calcium: 8.9 mg/dL (ref 8.9–10.3)
Chloride: 103 mmol/L (ref 98–111)
Creatinine, Ser: 0.71 mg/dL (ref 0.44–1.00)
GFR, Estimated: >60 mL/min (ref >60)
Glucose, Bld: 104 mg/dL — ABNORMAL HIGH (ref 70–99)
Potassium: 4 mmol/L (ref 3.5–5.1)
Sodium: 137 mmol/L (ref 135–145)

## 2024-01-14 LAB — POC URINE PREG, ED: Preg Test, Ur: NEGATIVE

## 2024-01-14 MED ORDER — CEPHALEXIN 500 MG PO CAPS: 500.0000 mg | ORAL_CAPSULE | Freq: Four times a day (QID) | ORAL | 0 refills | Status: AC

## 2024-01-14 MED ORDER — IOHEXOL 300 MG/ML  SOLN
100.0000 mL | Freq: Once | INTRAMUSCULAR | Status: AC | PRN
  Administered 2024-01-14: 100 mL via INTRAVENOUS

## 2024-01-14 MED ORDER — NYSTATIN 100000 UNIT/GM EX POWD: 1.0000 | Freq: Three times a day (TID) | CUTANEOUS | 0 refills | Status: AC

## 2024-01-14 NOTE — ED Triage Notes (Signed)
 Pt reports rash where her c-section scar is from 2 years ago. States that the rash has been there for over a week. Denies fevers or chills. Redness noted. Pt reports itching. Reports clear discharge from area. No hardness or firmness noted to area.

## 2024-01-14 NOTE — Discharge Instructions (Signed)
 Please take the medications as prescribed.  Please return for any new, worsening, or changing symptoms or other concerns.  It was a pleasure caring for you today.

## 2024-01-14 NOTE — ED Provider Notes (Signed)
 [facility] Provider Note    Event Date/Time   First MD Initiated Contact with Patient 01/14/24 0805     (approximate)   History   Rash   HPI  Lori Chan is a 28 y.o. female who presents today with a rash to her  C-section area.  Patient reports that the rash began approximately 1 week ago and she feels that it has gotten slightly worse.  She reports that it is painful.  She reports that her C-section was 2 years ago.  She has not had any fevers or chills.  Patient Active Problem List   Diagnosis Date Noted   Iron deficiency anemia 06/12/2023   Prediabetes 06/12/2023          Physical Exam   Triage Vital Signs: ED Triage Vitals [01/14/24 0754]  Encounter Vitals Group     BP 131/83     Girls Systolic BP Percentile      Girls Diastolic BP Percentile      Boys Systolic BP Percentile      Boys Diastolic BP Percentile      Pulse Rate 70     Resp 18     Temp 98.1 F (36.7 C)     Temp Source Oral     SpO2 100 %     Weight 289 lb (131.1 kg)     Height 5' 4 (1.626 m)     Head Circumference      Peak Flow      Pain Score 7     Pain Loc      Pain Education      Exclude from Growth Chart     Most recent vital signs: Vitals:   01/14/24 0754  BP: 131/83  Pulse: 70  Resp: 18  Temp: 98.1 F (36.7 C)  SpO2: 100%    Physical Exam Vitals and nursing note reviewed.  Constitutional:      General: Awake and alert. No acute distress.    Appearance: Normal appearance.  HENT:     Head: Normocephalic and atraumatic.     Mouth: Mucous membranes are moist.  Eyes:     General: PERRL. Normal EOMs        Right eye: No discharge.        Left eye: No discharge.     Conjunctiva/sclera: Conjunctivae normal.  Cardiovascular:     Rate and Rhythm: Normal rate and regular rhythm.     Pulses: Normal pulses.  Pulmonary:     Effort: Pulmonary effort is normal. No respiratory distress.     Breath sounds: Normal breath sounds.  Abdominal:      Abdomen is soft. There is no abdominal tenderness. No rebound or guarding. No distention. Erythema under her pannus to the right side, with satellite lesions.  Mildly tender to palpation.  Rash extends into groin area, though does not include the perineum Musculoskeletal:        General: No swelling. Normal range of motion.     Cervical back: Normal range of motion and neck supple.  Skin:    General: Skin is warm and dry.     Capillary Refill: Capillary refill takes less than 2 seconds.     Findings: No rash.  Neurological:     Mental Status: The patient is awake and alert.      ED Results / Procedures / Treatments   Labs (all labs ordered are listed, but only abnormal results are displayed) Labs Reviewed  BASIC METABOLIC  PANEL WITH GFR - Abnormal; Notable for the following components:      Result Value   Glucose, Bld 104 (*)    All other components within normal limits  CBC WITH DIFFERENTIAL/PLATELET - Abnormal; Notable for the following components:   Hemoglobin 10.2 (*)    HCT 34.0 (*)    MCV 77.3 (*)    MCH 23.2 (*)    RDW 16.0 (*)    Platelets 438 (*)    All other components within normal limits  POC URINE PREG, ED     EKG     RADIOLOGY I independently reviewed and interpreted imaging and agree with radiologists findings.     PROCEDURES:  Critical Care performed:   Procedures   MEDICATIONS ORDERED IN ED: Medications  iohexol (OMNIPAQUE) 300 MG/ML solution 100 mL (100 mLs Intravenous Contrast Given 01/14/24 1000)     IMPRESSION / MDM / ASSESSMENT AND PLAN / ED COURSE  I reviewed the triage vital signs and the nursing notes.   Differential diagnosis includes, but is not limited to, intertrigo, cellulitis, seroma, abscess.  Patient is awake and alert, hemodynamically stable and afebrile.  She is nontoxic in appearance.  Further workup is indicated.  Labs obtained are overall reassuring, no leukocytosis.  CT scan obtained given the location of her  erythema, and this was negative for abscess or seroma.  Exam is consistent with most likely intertrigo given that it is erythematous and underneath the pannus, with satellite lesions.  However, given that she reports that the rash is also painful, will treat for potentially superimposed cellulitis.  There is no fever, hemodynamic instability, leukocytosis, or radiographical evidence of deeper infection.  I do not feel that she needs hospitalization at this time, though we did discuss return precautions.  Patient understands and agrees with plan.  She was discharged in stable condition.   Patient's presentation is most consistent with acute complicated illness / injury requiring diagnostic workup.      FINAL CLINICAL IMPRESSION(S) / ED DIAGNOSES   Final diagnoses:  Cellulitis of groin  Intertrigo     Rx / DC Orders   ED Discharge Orders          Ordered    nystatin (MYCOSTATIN/NYSTOP) powder  3 times daily        01/14/24 1025    cephALEXin  (KEFLEX ) 500 MG capsule  4 times daily        01/14/24 1025             Note:  This document was prepared using Dragon voice recognition software and may include unintentional dictation errors.   Kyana Aicher E, PA-C 01/14/24 1209    Jacolyn Pae, MD 01/14/24 [phone]
